# Patient Record
Sex: Female | Born: 1990
Health system: Southern US, Community
[De-identification: ages and names within clinical notes are randomized; demographics above are authoritative.]

## PROBLEM LIST (undated history)

## (undated) ENCOUNTER — Emergency Department (HOSPITAL_COMMUNITY): Admission: EM | Payer: Medicaid Other

## (undated) DIAGNOSIS — N390 Urinary tract infection, site not specified: Secondary | ICD-10-CM

## (undated) DIAGNOSIS — O139 Gestational [pregnancy-induced] hypertension without significant proteinuria, unspecified trimester: Secondary | ICD-10-CM

## (undated) DIAGNOSIS — E119 Type 2 diabetes mellitus without complications: Secondary | ICD-10-CM

## (undated) DIAGNOSIS — R87629 Unspecified abnormal cytological findings in specimens from vagina: Secondary | ICD-10-CM

## (undated) HISTORY — PX: NO PAST SURGERIES: SHX2092

## (undated) HISTORY — DX: Gestational (pregnancy-induced) hypertension without significant proteinuria, unspecified trimester: O13.9

---

## 2011-01-27 ENCOUNTER — Emergency Department (HOSPITAL_COMMUNITY)
Admission: EM | Admit: 2011-01-27 | Discharge: 2011-01-28 | Disposition: A | Discharge status: Home / Self Care | Payer: BC Managed Care – PPO | Attending: Emergency Medicine | Admitting: Emergency Medicine

## 2011-01-27 DIAGNOSIS — N898 Other specified noninflammatory disorders of vagina: Secondary | ICD-10-CM | POA: Insufficient documentation

## 2011-01-27 DIAGNOSIS — N72 Inflammatory disease of cervix uteri: Secondary | ICD-10-CM | POA: Insufficient documentation

## 2011-01-27 LAB — URINALYSIS, ROUTINE W REFLEX MICROSCOPIC
Glucose, UA: NEGATIVE mg/dL
Nitrite: NEGATIVE
pH: 6 (ref 5.0–8.0)

## 2011-01-27 LAB — URINE MICROSCOPIC-ADD ON

## 2011-01-28 LAB — GC/CHLAMYDIA PROBE AMP, GENITAL: Chlamydia, DNA Probe: NEGATIVE

## 2011-01-28 LAB — WET PREP, GENITAL

## 2012-08-20 ENCOUNTER — Ambulatory Visit: Payer: BC Managed Care – PPO | Admitting: Obstetrics and Gynecology

## 2013-08-26 ENCOUNTER — Ambulatory Visit: Payer: Self-pay | Admitting: Women's Health

## 2013-11-09 ENCOUNTER — Emergency Department (HOSPITAL_COMMUNITY)
Admission: EM | Admit: 2013-11-09 | Discharge: 2013-11-09 | Disposition: A | Discharge status: Home / Self Care | Payer: BC Managed Care – PPO | Attending: Emergency Medicine | Admitting: Emergency Medicine

## 2013-11-09 ENCOUNTER — Encounter (HOSPITAL_COMMUNITY): Payer: Self-pay | Admitting: Emergency Medicine

## 2013-11-09 ENCOUNTER — Emergency Department (HOSPITAL_COMMUNITY): Payer: BC Managed Care – PPO

## 2013-11-09 DIAGNOSIS — B9689 Other specified bacterial agents as the cause of diseases classified elsewhere: Secondary | ICD-10-CM | POA: Insufficient documentation

## 2013-11-09 DIAGNOSIS — J069 Acute upper respiratory infection, unspecified: Secondary | ICD-10-CM | POA: Insufficient documentation

## 2013-11-09 DIAGNOSIS — N760 Acute vaginitis: Secondary | ICD-10-CM | POA: Insufficient documentation

## 2013-11-09 DIAGNOSIS — F17200 Nicotine dependence, unspecified, uncomplicated: Secondary | ICD-10-CM | POA: Insufficient documentation

## 2013-11-09 DIAGNOSIS — Z3202 Encounter for pregnancy test, result negative: Secondary | ICD-10-CM | POA: Insufficient documentation

## 2013-11-09 DIAGNOSIS — J988 Other specified respiratory disorders: Secondary | ICD-10-CM

## 2013-11-09 DIAGNOSIS — B9789 Other viral agents as the cause of diseases classified elsewhere: Secondary | ICD-10-CM

## 2013-11-09 DIAGNOSIS — R1110 Vomiting, unspecified: Secondary | ICD-10-CM | POA: Insufficient documentation

## 2013-11-09 DIAGNOSIS — N939 Abnormal uterine and vaginal bleeding, unspecified: Secondary | ICD-10-CM

## 2013-11-09 DIAGNOSIS — A499 Bacterial infection, unspecified: Secondary | ICD-10-CM | POA: Insufficient documentation

## 2013-11-09 LAB — URINALYSIS, ROUTINE W REFLEX MICROSCOPIC
BILIRUBIN URINE: NEGATIVE
Glucose, UA: NEGATIVE mg/dL
Ketones, ur: NEGATIVE mg/dL
Leukocytes, UA: NEGATIVE
Nitrite: NEGATIVE
PROTEIN: NEGATIVE mg/dL
Specific Gravity, Urine: 1.03 (ref 1.005–1.030)
UROBILINOGEN UA: 1 mg/dL (ref 0.0–1.0)
pH: 6 (ref 5.0–8.0)

## 2013-11-09 LAB — URINE MICROSCOPIC-ADD ON

## 2013-11-09 LAB — RAPID STREP SCREEN (MED CTR MEBANE ONLY): Streptococcus, Group A Screen (Direct): NEGATIVE

## 2013-11-09 LAB — WET PREP, GENITAL
TRICH WET PREP: NONE SEEN
Yeast Wet Prep HPF POC: NONE SEEN

## 2013-11-09 LAB — PREGNANCY, URINE: PREG TEST UR: NEGATIVE

## 2013-11-09 MED ORDER — ACETAMINOPHEN 500 MG PO TABS: 500.0000 mg | ORAL_TABLET | Freq: Four times a day (QID) | ORAL | Status: DC | PRN

## 2013-11-09 MED ORDER — IBUPROFEN 800 MG PO TABS: 800.0000 mg | ORAL_TABLET | Freq: Three times a day (TID) | ORAL | Status: DC

## 2013-11-09 MED ORDER — METRONIDAZOLE 500 MG PO TABS: 500.0000 mg | ORAL_TABLET | Freq: Two times a day (BID) | ORAL | Status: DC

## 2013-11-09 MED ORDER — GUAIFENESIN 100 MG/5ML PO LIQD: 100.0000 mg | ORAL | Status: DC | PRN

## 2013-11-09 NOTE — ED Provider Notes (Signed)
CSN: [id number]     Arrival date & time 11/09/13  4098 History  This chart was scribed for Francee Piccolo, PA-C, working with Shanna Cisco, MD by Blanchard Kelch, ED Scribe. This patient was seen in room TR07C/TR07C and the patient's care was started at 9:48 AM.    Chief Complaint  Patient presents with  . Influenza    The history is provided by the patient. No language interpreter was used.    HPI Comments: Helen Dorsey is a G0 23 y.o. female who presents to the Emergency Department complaining of constant, unchanged cough that began about three days ago. She has associated congestion, sore throat and subjective  fever. She states that she also had two episodes of non-bloody vomiting last night. She took Tylenol Multi Symptom last night, which provided temporary relief until the vomiting began. She denies sick contacts at home.  She also reports suprapubic abdominal pain that began last night but has subsided this morning. She states that she has had associated intermittent vaginal bleeding for about five days. She states it is very unusual for her to be spotting when not on her menstrual cycle. She denies any associated discharge or dysuria. She denies any past abdominal surgeries in the past. She denies any past history of pregnancies. She denies starting any new birth control medications. She does not have an OB-GYN currently.    History reviewed. No pertinent past medical history. No past surgical history on file. No family history on file. History  Substance Use Topics  . Smoking status: Current Every Day Smoker  . Smokeless tobacco: Not on file  . Alcohol Use: Yes   OB History   Grav Para Term Preterm Abortions TAB SAB Ect Mult Living                 Review of Systems  Constitutional: Positive for fever.  HENT: Positive for congestion and sore throat.   Respiratory: Positive for cough.   Gastrointestinal: Positive for vomiting.  Genitourinary: Positive for vaginal  bleeding. Negative for dysuria and vaginal discharge.  All other systems reviewed and are negative.    Allergies  Review of patient's allergies indicates no known allergies.  Home Medications   Current Outpatient Rx  Name  Route  Sig  Dispense  Refill  . Chlorphen-PE-Acetaminophen (TYLENOL ALLERGY MULTI-SYMPTOM PO)   Oral   Take 1,920 mg by mouth daily as needed (for congestion/sore throat).         Marland Kitchen acetaminophen (TYLENOL) 500 MG tablet   Oral   Take 1 tablet (500 mg total) by mouth every 6 (six) hours as needed.   30 tablet   0   . guaiFENesin (ROBITUSSIN) 100 MG/5ML liquid   Oral   Take 5-10 mLs (100-200 mg total) by mouth every 4 (four) hours as needed for cough.   60 mL   0   . ibuprofen (ADVIL,MOTRIN) 800 MG tablet   Oral   Take 1 tablet (800 mg total) by mouth 3 (three) times daily.   21 tablet   0   . metroNIDAZOLE (FLAGYL) 500 MG tablet   Oral   Take 1 tablet (500 mg total) by mouth 2 (two) times daily.   14 tablet   0    Triage Vitals: BP 144/85  Pulse 82  Temp(Src) 97.7 F (36.5 C)  SpO2 100%  Physical Exam  Nursing note and vitals reviewed. Constitutional: She is oriented to person, place, and time. She appears well-developed and well-nourished. No  distress.  HENT:  Head: Normocephalic and atraumatic.  Right Ear: External ear normal.  Left Ear: External ear normal.  Nose: Nose normal.  Mouth/Throat: Uvula is midline. Posterior oropharyngeal erythema present. No oropharyngeal exudate, posterior oropharyngeal edema or tonsillar abscesses.  Eyes: Conjunctivae are normal.  Neck: Normal range of motion. Neck supple.  Cardiovascular: Normal rate, regular rhythm and normal heart sounds.   Pulmonary/Chest: Effort normal and breath sounds normal. No respiratory distress.  Abdominal: Soft. Bowel sounds are normal. She exhibits no distension. There is no tenderness. There is no rebound and no guarding.  Musculoskeletal: Normal range of motion.   Lymphadenopathy:    She has no cervical adenopathy.  Neurological: She is alert and oriented to person, place, and time.  Skin: Skin is warm and dry. She is not diaphoretic.  Psychiatric: She has a normal mood and affect.   Exam performed by Francee Piccolo L,  exam chaperoned Date: 11/09/2013 Pelvic exam: normal external genitalia without evidence of trauma. VULVA: normal appearing vulva with no masses, tenderness or lesion. VAGINA: normal appearing vagina with normal color and discharge, no lesions. CERVIX: normal appearing cervix without lesions, cervical motion tenderness absent, cervical os closed with out purulent discharge; vaginal discharge - bloody and scant, Wet prep and DNA probe for chlamydia and GC obtained.   ADNEXA: normal adnexa in size, nontender and no masses UTERUS: uterus is normal size, shape, consistency and nontender.   ED Course  Procedures (including critical care time)  DIAGNOSTIC STUDIES: Oxygen Saturation is 100% on room air, normal by my interpretation.    COORDINATION OF CARE: 9:55 AM -Will order chest x-ray, urine pregnancy test, Urinalysis, Wet prep, GC/Chlamydia and rapid strep screen. Patient verbalizes understanding and agrees with treatment plan.    Labs Review Labs Reviewed  WET PREP, GENITAL - Abnormal; Notable for the following:    Clue Cells Wet Prep HPF POC MODERATE (*)    WBC, Wet Prep HPF POC FEW (*)    All other components within normal limits  URINALYSIS, ROUTINE W REFLEX MICROSCOPIC - Abnormal; Notable for the following:    Hgb urine dipstick MODERATE (*)    All other components within normal limits  RAPID STREP SCREEN  GC/CHLAMYDIA PROBE AMP  CULTURE, GROUP A STREP  PREGNANCY, URINE  URINE MICROSCOPIC-ADD ON   Imaging Review Dg Chest 2 View  11/09/2013   CLINICAL DATA:  cough ,fever cough ,fever  EXAM: CHEST  2 VIEW  COMPARISON:  None.  FINDINGS: The heart size and mediastinal contours are within normal limits. Both  lungs are clear. The visualized skeletal structures are unremarkable.  IMPRESSION: No active cardiopulmonary disease.   Electronically Signed   By: Elige Ko   On: 11/09/2013 10:51    EKG Interpretation   None       MDM   1. Viral respiratory illness   2. Vaginal bleeding   3. Bacterial vaginosis    Filed Vitals:   11/09/13 1129  BP: 104/55  Pulse: 88  Temp:   Resp: 18    Afebrile, NAD, non-toxic appearing, AAOx4. I have reviewed nursing notes, vital signs, and all appropriate lab and imaging results for this patient.  1) URI: Pt CXR negative for acute infiltrate. Patients symptoms are consistent with URI, likely viral etiology. Discussed that antibiotics are not indicated for viral infections. Pt will be discharged with symptomatic treatment.  Verbalizes understanding and is agreeable with plan.   2) Vaginal Bleeding: Pelvic exam with scant bloody discharge. Pelvic exam  otherwise unremarkable. No CMT. No adnexal fullness. Pregnancy test is negative, no concern for ectopic pregnancy. Wet prep reviewed. Advised Ob/Gyn follow up for dysfunctional uterine bleeding.  Pt was not prophylactically treated given low historical suspicion and only few WBCs on wet prep. Pt advised that she will receive a call in 48 hours if the test is positive and to refrain from sexual activity for 48 hours. If the test is positive, she will need to return to ED or go to Ob/Gyn for treatment pt is then advised to refrain from sexual activity for 10 days for the medicine to take effect.  Pt not concerning for PID because hemodynamically stable and no cervical motion tenderness on pelvic exam.  Discussed that because pt has had recent unprotected sex, might want to consider getting tested for HIV as well. Counseled pt that latex condoms are the only way to prevent against STDs or HIV.  3) BV: Pt has also been treated with flagyl for Bacterial Vaginosis. Pt has been advised to not drink alcohol while on this  medication.   Patient to be discharged with instructions to follow up with OBGYN. Pt understands GC/Chlamydia cultures pending and that they will need to inform all sexual partners within the last 6 months if results return positive. Pt is hemodynamically stable & in NAD prior to dc.  I personally performed the services described in this documentation, which was scribed in my presence. The recorded information has been reviewed and is accurate.     Jeannetta EllisJennifer L Kishawn Pickar, PA-C 11/09/13 1258

## 2013-11-09 NOTE — ED Notes (Signed)
Patient transported to X-ray 

## 2013-11-09 NOTE — ED Provider Notes (Signed)
Medical screening examination/treatment/procedure(s) were performed by non-physician practitioner and as supervising physician I was immediately available for consultation/collaboration.  Shanna CiscoMegan E Yoel Kaufhold, MD 11/09/13 2013

## 2013-11-09 NOTE — ED Notes (Signed)
Vaginal bleeding/spotting x 5 days. States always has had irregular periods, last one in spring of 2014. Reports lower abdominal pain. Also reports cough/congestion.

## 2013-11-09 NOTE — Discharge Instructions (Signed)
Please follow up with your primary care physician in 1-2 days. If you do not have one please call the [facility] number listed above. Please follow up with the Ob/Gyn to schedule a follow up appointment. Please alternate between Motrin and Tylenol to help with aches and pains. Please take your antibiotic until completion. Please take Robitussin to help with cough. Please read all discharge instructions and return precautions.   Upper Respiratory Infection, Adult An upper respiratory infection (URI) is also sometimes known as the common cold. The upper respiratory tract includes the nose, sinuses, throat, trachea, and bronchi. Bronchi are the airways leading to the lungs. Most people improve within 1 week, but symptoms can last up to 2 weeks. A residual cough may last even longer.  CAUSES Many different viruses can infect the tissues lining the upper respiratory tract. The tissues become irritated and inflamed and often become very moist. Mucus production is also common. A cold is contagious. You can easily spread the virus to others by oral contact. This includes kissing, sharing a glass, coughing, or sneezing. Touching your mouth or nose and then touching a surface, which is then touched by another person, can also spread the virus. SYMPTOMS  Symptoms typically develop 1 to 3 days after you come in contact with a cold virus. Symptoms vary from person to person. They may include:  Runny nose.  Sneezing.  Nasal congestion.  Sinus irritation.  Sore throat.  Loss of voice (laryngitis).  Cough.  Fatigue.  Muscle aches.  Loss of appetite.  Headache.  Low-grade fever. DIAGNOSIS  You might diagnose your own cold based on familiar symptoms, since most people get a cold 2 to 3 times a year. Your caregiver can confirm this based on your exam. Most importantly, your caregiver can check that your symptoms are not due to another disease such as strep throat, sinusitis,  pneumonia, asthma, or epiglottitis. Blood tests, throat tests, and X-rays are not necessary to diagnose a common cold, but they may sometimes be helpful in excluding other more serious diseases. Your caregiver will decide if any further tests are required. RISKS AND COMPLICATIONS  You may be at risk for a more severe case of the common cold if you smoke cigarettes, have chronic heart disease (such as heart failure) or lung disease (such as asthma), or if you have a weakened immune system. The very young and very old are also at risk for more serious infections. Bacterial sinusitis, middle ear infections, and bacterial pneumonia can complicate the common cold. The common cold can worsen asthma and chronic obstructive pulmonary disease (COPD). Sometimes, these complications can require emergency medical care and may be life-threatening. PREVENTION  The best way to protect against getting a cold is to practice good hygiene. Avoid oral or hand contact with people with cold symptoms. Wash your hands often if contact occurs. There is no clear evidence that vitamin C, vitamin E, echinacea, or exercise reduces the chance of developing a cold. However, it is always recommended to get plenty of rest and practice good nutrition. TREATMENT  Treatment is directed at relieving symptoms. There is no cure. Antibiotics are not effective, because the infection is caused by a virus, not by bacteria. Treatment may include:  Increased fluid intake. Sports drinks offer valuable electrolytes, sugars, and fluids.  Breathing heated mist or steam (vaporizer or shower).  Eating chicken soup or other clear broths, and maintaining good nutrition.  Getting plenty of rest.  Using gargles or lozenges  for comfort.  Controlling fevers with ibuprofen or acetaminophen as directed by your caregiver.  Increasing usage of your inhaler if you have asthma. Zinc gel and zinc lozenges, taken in the first 24 hours of the common cold, can  shorten the duration and lessen the severity of symptoms. Pain medicines may help with fever, muscle aches, and throat pain. A variety of non-prescription medicines are available to treat congestion and runny nose. Your caregiver can make recommendations and may suggest nasal or lung inhalers for other symptoms.  HOME CARE INSTRUCTIONS   Only take over-the-counter or prescription medicines for pain, discomfort, or fever as directed by your caregiver.  Use a warm mist humidifier or inhale steam from a shower to increase air moisture. This may keep secretions moist and make it easier to breathe.  Drink enough water and fluids to keep your urine clear or pale yellow.  Rest as needed.  Return to work when your temperature has returned to normal or as your caregiver advises. You may need to stay home longer to avoid infecting others. You can also use a face mask and careful hand washing to prevent spread of the virus. SEEK MEDICAL CARE IF:   After the first few days, you feel you are getting worse rather than better.  You need your caregiver's advice about medicines to control symptoms.  You develop chills, worsening shortness of breath, or brown or red sputum. These may be signs of pneumonia.  You develop yellow or brown nasal discharge or pain in the face, especially when you bend forward. These may be signs of sinusitis.  You develop a fever, swollen neck glands, pain with swallowing, or white areas in the back of your throat. These may be signs of strep throat. SEEK IMMEDIATE MEDICAL CARE IF:   You have a fever.  You develop severe or persistent headache, ear pain, sinus pain, or chest pain.  You develop wheezing, a prolonged cough, cough up blood, or have a change in your usual mucus (if you have chronic lung disease).  You develop sore muscles or a stiff neck. Document Released: 03/25/2001 Document Revised: 12/22/2011 Document Reviewed: 01/31/2011 [facility] Patient Information 2014  [employer] [place].   Dysfunctional Uterine Bleeding Normally, menstrual periods begin between ages 1711 to 117 in young women. A normal menstrual cycle/period may begin every 23 days up to 35 days and lasts from 1 to 7 days. Around 12 to 14 days before your menstrual period starts, ovulation (ovary produces an egg) occurs. When counting the time between menstrual periods, count from the first day of bleeding of the previous period to the first day of bleeding of the next period. Dysfunctional (abnormal) uterine bleeding is bleeding that is different from a normal menstrual period. Your periods may come earlier or later than usual. They may be lighter, have blood clots or be heavier. You may have bleeding between periods, or you may skip one period or more. You may have bleeding after sexual intercourse, bleeding after menopause, or no menstrual period. CAUSES   Pregnancy (normal, miscarriage, tubal).  IUDs (intrauterine device, birth control).  Birth control pills.  Hormone treatment.  Menopause.  Infection of the cervix.  Blood clotting problems.  Infection of the inside lining of the uterus.  Endometriosis, inside lining of the uterus growing in the pelvis and other female organs.  Adhesions (scar tissue) inside the uterus.  Obesity or severe weight loss.  Uterine polyps inside the uterus.  Cancer of the vagina, cervix, or uterus.  Ovarian cysts or polycystic ovary syndrome.  Medical problems (diabetes, thyroid disease).  Uterine fibroids (noncancerous tumor).  Problems with your female hormones.  Endometrial hyperplasia, very thick lining and enlarged cells inside of the uterus.  Medicines that interfere with ovulation.  Radiation to the pelvis or abdomen.  Chemotherapy. DIAGNOSIS   Your doctor will discuss the history of your menstrual periods, medicines you are taking, changes in your weight, stress in your life, and any medical problems you may have.  Your doctor  will do a physical and pelvic examination.  Your doctor may want to perform certain tests to make a diagnosis, such as:  Pap test.  Blood tests.  Cultures for infection.  CT scan.  Ultrasound.  Hysteroscopy.  Laparoscopy.  MRI.  Hysterosalpingography.  D and C.  Endometrial biopsy. TREATMENT  Treatment will depend on the cause of the dysfunctional uterine bleeding (DUB). Treatment may include:  Observing your menstrual periods for a couple of months.  Prescribing medicines for medical problems, including:  Antibiotics.  Hormones.  Birth control pills.  Removing an IUD (intrauterine device, birth control).  Surgery:  D and C (scrape and remove tissue from inside the uterus).  Laparoscopy (examine inside the abdomen with a lighted tube).  Uterine ablation (destroy lining of the uterus with electrical current, laser, heat, or freezing).  Hysteroscopy (examine cervix and uterus with a lighted tube).  Hysterectomy (remove the uterus). HOME CARE INSTRUCTIONS   If medicines were prescribed, take exactly as directed. Do not change or switch medicines without consulting your caregiver.  Long term heavy bleeding may result in iron deficiency. Your caregiver may have prescribed iron pills. They help replace the iron that your body lost from heavy bleeding. Take exactly as directed.  Do not take aspirin or medicines that contain aspirin one week before or during your menstrual period. Aspirin may make the bleeding worse.  If you need to change your sanitary pad or tampon more than once every 2 hours, stay in bed with your feet elevated and a cold pack on your lower abdomen. Rest as much as possible, until the bleeding stops or slows down.  Eat well-balanced meals. Eat foods high in iron. Examples are:  Leafy green vegetables.  Whole-grain breads and cereals.  Eggs.  Meat.  Liver.  Do not try to lose weight until the abnormal bleeding has stopped and your  blood iron level is back to normal. Do not lift more than ten pounds or do strenuous activities when you are bleeding.  For a couple of months, make note on your calendar, marking the start and ending of your period, and the type of bleeding (light, medium, heavy, spotting, clots or missed periods). This is for your caregiver to better evaluate your problem. SEEK MEDICAL CARE IF:   You develop nausea (feeling sick to your stomach) and vomiting, dizziness, or diarrhea while you are taking your medicine.  You are getting lightheaded or weak.  You have any problems that may be related to the medicine you are taking.  You develop pain with your DUB.  You want to remove your IUD.  You want to stop or change your birth control pills or hormones.  You have any type of abnormal bleeding mentioned above.  You are over 27 years old and have not had a menstrual period yet.  You are 23 years old and you are still having menstrual periods.  You have any of the symptoms mentioned above.  You develop a rash. SEEK  IMMEDIATE MEDICAL CARE IF:   An oral temperature above 102 F (38.9 C) develops.  You develop chills.  You are changing your sanitary pad or tampon more than once an hour.  You develop abdominal pain.  You pass out or faint. Document Released: 09/26/2000 Document Revised: 12/22/2011 Document Reviewed: 08/28/2009 [facility] Patient Information 2014 [place], [place]. Bacterial Vaginosis Bacterial vaginosis is a vaginal infection that occurs when the normal balance of bacteria in the vagina is disrupted. It results from an overgrowth of certain bacteria. This is the most common vaginal infection in women of childbearing age. Treatment is important to prevent complications, especially in pregnant women, as it can cause a premature delivery. CAUSES  Bacterial vaginosis is caused by an increase in harmful bacteria that are normally present in smaller amounts in the vagina. Several  different kinds of bacteria can cause bacterial vaginosis. However, the reason that the condition develops is not fully understood. RISK FACTORS Certain activities or behaviors can put you at an increased risk of developing bacterial vaginosis, including:  Having a new sex partner or multiple sex partners.  Douching.  Using an intrauterine device (IUD) for contraception. Women do not get bacterial vaginosis from toilet seats, bedding, swimming pools, or contact with objects around them. SIGNS AND SYMPTOMS  Some women with bacterial vaginosis have no signs or symptoms. Common symptoms include:  Grey vaginal discharge.  A fishlike odor with discharge, especially after sexual intercourse.  Itching or burning of the vagina and vulva.  Burning or pain with urination. DIAGNOSIS  Your health care provider will take a medical history and examine the vagina for signs of bacterial vaginosis. A sample of vaginal fluid may be taken. Your health care provider will look at this sample under a microscope to check for bacteria and abnormal cells. A vaginal pH test may also be done.  TREATMENT  Bacterial vaginosis may be treated with antibiotic medicines. These may be given in the form of a pill or a vaginal cream. A second round of antibiotics may be prescribed if the condition comes back after treatment.  HOME CARE INSTRUCTIONS   Only take over-the-counter or prescription medicines as directed by your health care provider.  If antibiotic medicine was prescribed, take it as directed. Make sure you finish it even if you start to feel better.  Do not have sex until treatment is completed.  Tell all sexual partners that you have a vaginal infection. They should see their health care provider and be treated if they have problems, such as a mild rash or itching.  Practice safe sex by using condoms and only having one sex partner. SEEK MEDICAL CARE IF:   Your symptoms are not improving after 3 days of  treatment.  You have increased discharge or pain.  You have a fever. MAKE SURE YOU:   Understand these instructions.  Will watch your condition.  Will get help right away if you are not doing well or get worse. FOR MORE INFORMATION  Centers for Disease Control and Prevention, Division of STD Prevention: [employer] American Sexual Health Association (ASHA): www.ashastd.org  Document Released: 09/29/2005 Document Revised: 07/20/2013 Document Reviewed: 05/11/2013 [facility] Patient Information 2014 [place], [place].

## 2013-11-09 NOTE — ED Notes (Signed)
3 day hx of flu like s/s h/a cough congestion

## 2013-11-10 LAB — GC/CHLAMYDIA PROBE AMP
CT PROBE, AMP APTIMA: NEGATIVE
GC PROBE AMP APTIMA: NEGATIVE

## 2013-11-11 LAB — CULTURE, GROUP A STREP

## 2014-01-29 DIAGNOSIS — F17200 Nicotine dependence, unspecified, uncomplicated: Secondary | ICD-10-CM | POA: Insufficient documentation

## 2014-01-29 DIAGNOSIS — R61 Generalized hyperhidrosis: Secondary | ICD-10-CM | POA: Insufficient documentation

## 2014-01-29 DIAGNOSIS — R55 Syncope and collapse: Secondary | ICD-10-CM | POA: Insufficient documentation

## 2014-01-29 DIAGNOSIS — Z3202 Encounter for pregnancy test, result negative: Secondary | ICD-10-CM | POA: Insufficient documentation

## 2014-01-29 DIAGNOSIS — E876 Hypokalemia: Secondary | ICD-10-CM | POA: Insufficient documentation

## 2014-01-29 DIAGNOSIS — R109 Unspecified abdominal pain: Secondary | ICD-10-CM | POA: Insufficient documentation

## 2014-01-30 ENCOUNTER — Emergency Department (HOSPITAL_COMMUNITY)
Admission: EM | Admit: 2014-01-30 | Discharge: 2014-01-30 | Disposition: A | Discharge status: Home / Self Care | Payer: BC Managed Care – PPO | Attending: Emergency Medicine | Admitting: Emergency Medicine

## 2014-01-30 ENCOUNTER — Encounter (HOSPITAL_COMMUNITY): Payer: Self-pay | Admitting: Emergency Medicine

## 2014-01-30 DIAGNOSIS — E876 Hypokalemia: Secondary | ICD-10-CM

## 2014-01-30 DIAGNOSIS — R109 Unspecified abdominal pain: Secondary | ICD-10-CM

## 2014-01-30 DIAGNOSIS — R55 Syncope and collapse: Secondary | ICD-10-CM

## 2014-01-30 DIAGNOSIS — R110 Nausea: Secondary | ICD-10-CM

## 2014-01-30 LAB — I-STAT CHEM 8, ED
BUN: 10 mg/dL (ref 6–23)
CHLORIDE: 106 meq/L (ref 96–112)
Calcium, Ion: 1.18 mmol/L (ref 1.12–1.23)
Creatinine, Ser: 0.9 mg/dL (ref 0.50–1.10)
Glucose, Bld: 99 mg/dL (ref 70–99)
HEMATOCRIT: 46 % (ref 36.0–46.0)
Hemoglobin: 15.6 g/dL — ABNORMAL HIGH (ref 12.0–15.0)
POTASSIUM: 3.5 meq/L — AB (ref 3.7–5.3)
SODIUM: 142 meq/L (ref 137–147)
TCO2: 24 mmol/L (ref 0–100)

## 2014-01-30 LAB — CBC
HCT: 39.4 % (ref 36.0–46.0)
Hemoglobin: 14.1 g/dL (ref 12.0–15.0)
MCH: 27.7 pg (ref 26.0–34.0)
MCHC: 35.8 g/dL (ref 30.0–36.0)
MCV: 77.4 fL — ABNORMAL LOW (ref 78.0–100.0)
PLATELETS: 283 10*3/uL (ref 150–400)
RBC: 5.09 MIL/uL (ref 3.87–5.11)
RDW: 14.5 % (ref 11.5–15.5)
WBC: 10.8 10*3/uL — AB (ref 4.0–10.5)

## 2014-01-30 LAB — URINALYSIS, ROUTINE W REFLEX MICROSCOPIC
BILIRUBIN URINE: NEGATIVE
Glucose, UA: NEGATIVE mg/dL
Hgb urine dipstick: NEGATIVE
Ketones, ur: 15 mg/dL — AB
Leukocytes, UA: NEGATIVE
NITRITE: NEGATIVE
Protein, ur: NEGATIVE mg/dL
SPECIFIC GRAVITY, URINE: 1.036 — AB (ref 1.005–1.030)
UROBILINOGEN UA: 0.2 mg/dL (ref 0.0–1.0)
pH: 5.5 (ref 5.0–8.0)

## 2014-01-30 LAB — POC URINE PREG, ED: PREG TEST UR: NEGATIVE

## 2014-01-30 MED ORDER — ONDANSETRON HCL 4 MG PO TABS: 4.0000 mg | ORAL_TABLET | Freq: Four times a day (QID) | ORAL | Status: DC | PRN

## 2014-01-30 MED ORDER — POTASSIUM CHLORIDE CRYS ER 20 MEQ PO TBCR
40.0000 meq | EXTENDED_RELEASE_TABLET | Freq: Once | ORAL | Status: AC
  Administered 2014-01-30: 40 meq via ORAL
  Filled 2014-01-30: qty 2

## 2014-01-30 MED ORDER — SODIUM CHLORIDE 0.9 % IV SOLN: 1000.0000 mL | INTRAVENOUS | Status: DC

## 2014-01-30 MED ORDER — SODIUM CHLORIDE 0.9 % IV SOLN
1000.0000 mL | Freq: Once | INTRAVENOUS | Status: AC
  Administered 2014-01-30: 1000 mL via INTRAVENOUS

## 2014-01-30 MED ORDER — ONDANSETRON HCL 4 MG/2ML IJ SOLN
4.0000 mg | Freq: Once | INTRAMUSCULAR | Status: AC
  Administered 2014-01-30: 4 mg via INTRAVENOUS
  Filled 2014-01-30: qty 2

## 2014-01-30 NOTE — ED Notes (Signed)
Patient presents stating she was feeling light headed at home.  Walked to the bathroom and rested her head on the sink   Next thing she remembers her boyfriend was shaking her calling her name.  Also stated that she has an undescribeable feeling in her stomach

## 2014-01-30 NOTE — ED Provider Notes (Signed)
CSN: [id number]     Arrival date & time 01/29/14  2354 History   First MD Initiated Contact with Patient 01/30/14 [phone]     Chief Complaint  Patient presents with  . Loss of Consciousness     (Consider location/radiation/quality/duration/timing/severity/associated sxs/prior Treatment) Patient is a 23 y.o. female presenting with syncope. The history is provided by the patient.  Loss of Consciousness She states that she had a "indescribable feeling" in her abdomen. This was a pressure feeling in the midabdomen with intense nausea. She was feeling a little lightheaded and she went up to the bathroom and as she leaned over the sink, she passed out. Loss of consciousness was fairly brief-she estimates at less than 1 minute. There is associated diaphoresis. She feels considerably better now, although her stomach still has an empty feeling. There is minimal residual nausea and she is feeling slightly lightheaded. There is no chest pain, heaviness, tightness, pressure. She denies fever or or chills.  History reviewed. No pertinent past medical history. History reviewed. No pertinent past surgical history. History reviewed. No pertinent family history. History  Substance Use Topics  . Smoking status: Current Every Day Smoker  . Smokeless tobacco: Never Used  . Alcohol Use: Yes     Comment: ocassionally   OB History   Grav Para Term Preterm Abortions TAB SAB Ect Mult Living                 Review of Systems  Cardiovascular: Positive for syncope.  All other systems reviewed and are negative.     Allergies  Review of patient's allergies indicates no known allergies.  Home Medications   Prior to Admission medications   Not on File   BP 114/67  Pulse 88  Temp(Src) 97.6 F (36.4 C) (Oral)  Resp 18  Ht 5\' 3"  (1.6 m)  Wt 218 lb 1 oz (98.913 kg)  BMI 38.64 kg/m2  SpO2 99%  LMP 01/22/2014 Physical Exam  Nursing note and vitals reviewed.  23 year old female, resting comfortably  and in no acute distress. Vital signs are normal. Oxygen saturation is 99%, which is normal. Head is normocephalic and atraumatic. PERRLA, EOMI. Oropharynx is clear. Neck is nontender and supple without adenopathy or JVD. Back is nontender and there is no CVA tenderness. Lungs are clear without rales, wheezes, or rhonchi. Chest is nontender. Heart has regular rate and rhythm without murmur. Abdomen is soft, flat, nontender without masses or hepatosplenomegaly and peristalsis is hypoactive. Extremities have no cyanosis or edema, full range of motion is present. Skin is warm and dry without rash. Neurologic: Mental status is normal, cranial nerves are intact, there are no motor or sensory deficits.  ED Course  Procedures (including critical care time) Labs Review Results for orders placed during the hospital encounter of 01/30/14  CBC      Result Value Ref Range   WBC 10.8 (*) 4.0 - 10.5 K/uL   RBC 5.09  3.87 - 5.11 MIL/uL   Hemoglobin 14.1  12.0 - 15.0 g/dL   HCT 95.639.4  21.336.0 - 08.646.0 %   MCV 77.4 (*) 78.0 - 100.0 fL   MCH 27.7  26.0 - 34.0 pg   MCHC 35.8  30.0 - 36.0 g/dL   RDW 57.814.5  46.911.5 - 62.915.5 %   Platelets 283  150 - 400 K/uL  URINALYSIS, ROUTINE W REFLEX MICROSCOPIC      Result Value Ref Range   Color, Urine YELLOW  YELLOW   APPearance CLEAR  CLEAR   Specific Gravity, Urine 1.036 (*) 1.005 - 1.030   pH 5.5  5.0 - 8.0   Glucose, UA NEGATIVE  NEGATIVE mg/dL   Hgb urine dipstick NEGATIVE  NEGATIVE   Bilirubin Urine NEGATIVE  NEGATIVE   Ketones, ur 15 (*) NEGATIVE mg/dL   Protein, ur NEGATIVE  NEGATIVE mg/dL   Urobilinogen, UA 0.2  0.0 - 1.0 mg/dL   Nitrite NEGATIVE  NEGATIVE   Leukocytes, UA NEGATIVE  NEGATIVE  I-STAT CHEM 8, ED      Result Value Ref Range   Sodium 142  137 - 147 mEq/L   Potassium 3.5 (*) 3.7 - 5.3 mEq/L   Chloride 106  96 - 112 mEq/L   BUN 10  6 - 23 mg/dL   Creatinine, Ser 4.090.90  0.50 - 1.10 mg/dL   Glucose, Bld 99  70 - 99 mg/dL   Calcium, Ion 8.111.18   9.141.12 - 1.23 mmol/L   TCO2 24  0 - 100 mmol/L   Hemoglobin 15.6 (*) 12.0 - 15.0 g/dL   HCT 78.246.0  95.636.0 - 21.346.0 %  POC URINE PREG, ED      Result Value Ref Range   Preg Test, Ur NEGATIVE  NEGATIVE   MDM   Final diagnoses:  Vasovagal syncope  Nausea  Abdominal pain  Hypokalemia    Syncope which appears to be vasovagal. Abdominal discomfort of uncertain cause. Urinalysis is significant for small ketones and high specific gravity suggesting mild dehydration. She'll be given IV hydration in the ED and IV ondansetron. Mild hypokalemia is noted and she's given a dose of oral potassium. I anticipate discharging her with a prescription for ondansetron for nausea.  She feels significantly better after the above noted treatment and is discharged.  Dione Boozeavid Markhi Kleckner, MD 01/30/14 [phone]

## 2014-01-30 NOTE — Discharge Instructions (Signed)
Vasovagal Syncope, Adult °Syncope, commonly known as fainting, is a temporary loss of consciousness. It occurs when the blood flow to the brain is reduced. Vasovagal syncope (also called neurocardiogenic syncope) is a fainting spell in which the blood flow to the brain is reduced because of a sudden drop in heart rate and blood pressure. Vasovagal syncope occurs when the brain and the cardiovascular system (blood vessels) do not adequately communicate and respond to each other. This is the most common cause of fainting. It often occurs in response to fear or some other type of emotional or physical stress. The body has a reaction in which the heart starts beating too slowly or the blood vessels expand, reducing blood pressure. This type of fainting spell is generally considered harmless. However, injuries can occur if a person takes a sudden fall during a fainting spell.  °CAUSES  °Vasovagal syncope occurs when a person's blood pressure and heart rate decrease suddenly, usually in response to a trigger. Many things and situations can trigger an episode. Some of these include:  °· Pain.   °· Fear.   °· The sight of blood or medical procedures, such as blood being drawn from a vein.   °· Common activities, such as coughing, swallowing, stretching, or going to the bathroom.   °· Emotional stress.   °· Prolonged standing, especially in a warm environment.   °· Lack of sleep or rest.   °· Prolonged lack of food.   °· Prolonged lack of fluids.   °· Recent illness. °· The use of certain drugs that affect blood pressure, such as cocaine, alcohol, marijuana, inhalants, and opiates.   °SYMPTOMS  °Before the fainting episode, you may:  °· Feel dizzy or light headed.   °· Become pale. °· Sense that you are going to faint.   °· Feel like the room is spinning.   °· Have tunnel vision, only seeing directly in front of you.   °· Feel sick to your stomach (nauseous).   °· See spots or slowly lose vision.   °· Hear ringing in your  ears.   °· Have a headache.   °· Feel warm and sweaty.   °· Feel a sensation of pins and needles. °During the fainting spell, you will generally be unconscious for no longer than a couple minutes before waking up and returning to normal. If you get up too quickly before your body can recover, you may faint again. Some twitching or jerky movements may occur during the fainting spell.  °DIAGNOSIS  °Your caregiver will ask about your symptoms, take a medical history, and perform a physical exam. Various tests may be done to rule out other causes of fainting. These may include blood tests and tests to check the heart, such as electrocardiography, echocardiography, and possibly an electrophysiology study. When other causes have been ruled out, a test may be done to check the body's response to changes in position (tilt table test). °TREATMENT  °Most cases of vasovagal syncope do not require treatment. Your caregiver may recommend ways to avoid fainting triggers and may provide home strategies for preventing fainting. If you must be exposed to a possible trigger, you can drink additional fluids to help reduce your chances of having an episode of vasovagal syncope. If you have warning signs of an oncoming episode, you can respond by positioning yourself favorably (lying down). °If your fainting spells continue, you may be given medicines to prevent fainting. Some medicines may help make you more resistant to repeated episodes of vasovagal syncope. Special exercises or compression stockings may be recommended. In rare cases, the surgical placement   of a pacemaker is considered. HOME CARE INSTRUCTIONS   Learn to identify the warning signs of vasovagal syncope.   Sit or lie down at the first warning sign of a fainting spell. If sitting, put your head down between your legs. If you lie down, swing your legs up in the air to increase blood flow to the brain.   Avoid hot tubs and saunas.  Avoid prolonged  standing.  Drink enough fluids to keep your urine clear or pale yellow. Avoid caffeine.  Increase salt in your diet as directed by your caregiver.   If you have to stand for a long time, perform movements such as:   Crossing your legs.   Flexing and stretching your leg muscles.   Squatting.   Moving your legs.   Bending over.   Only take over-the-counter or prescription medicines as directed by your caregiver. Do not suddenly stop any medicines without asking your caregiver first. SEEK MEDICAL CARE IF:   Your fainting spells continue or happen more frequently in spite of treatment.   You lose consciousness for more than a couple minutes.  You have fainting spells during or after exercising or after being startled.   You have new symptoms that occur with the fainting spells, such as:   Shortness of breath.  Chest pain.   Irregular heartbeat.   You have episodes of twitching or jerky movements that last longer than a few seconds.  You have episodes of twitching or jerky movements without obvious fainting. SEEK IMMEDIATE MEDICAL CARE IF:   You have injuries or bleeding after a fainting spell.   You have episodes of twitching or jerky movements that last longer than 5 minutes.   You have more than one spell of twitching or jerky movements before returning to consciousness after fainting. MAKE SURE YOU:   Understand these instructions.  Will watch your condition.  Will get help right away if you are not doing well or get worse. Document Released: 09/15/2012 Document Reviewed: 09/15/2012 [facility] Patient Information 2014 [place], [place].   Abdominal Pain, Adult Many things can cause abdominal pain. Usually, abdominal pain is not caused by a disease and will improve without treatment. It can often be observed and treated at home. Your health care provider will do a physical exam and possibly order blood tests and X-rays to help determine the  seriousness of your pain. However, in many cases, more time must pass before a clear cause of the pain can be found. Before that point, your health care provider may not know if you need more testing or further treatment. HOME CARE INSTRUCTIONS  Monitor your abdominal pain for any changes. The following actions may help to alleviate any discomfort you are experiencing:  Only take over-the-counter or prescription medicines as directed by your health care provider.  Do not take laxatives unless directed to do so by your health care provider.  Try a clear liquid diet (broth, tea, or water) as directed by your health care provider. Slowly move to a bland diet as tolerated. SEEK MEDICAL CARE IF:  You have unexplained abdominal pain.  You have abdominal pain associated with nausea or diarrhea.  You have pain when you urinate or have a bowel movement.  You experience abdominal pain that wakes you in the night.  You have abdominal pain that is worsened or improved by eating food.  You have abdominal pain that is worsened with eating fatty foods. SEEK IMMEDIATE MEDICAL CARE IF:   Your pain does  not go away within 2 hours.  You have a fever.  You keep throwing up (vomiting).  Your pain is felt only in portions of the abdomen, such as the right side or the left lower portion of the abdomen.  You pass bloody or black tarry stools. MAKE SURE YOU:  Understand these instructions.   Will watch your condition.   Will get help right away if you are not doing well or get worse.  Document Released: 07/09/2005 Document Revised: 07/20/2013 Document Reviewed: 06/08/2013 [facility] Patient Information 2014 [place], [place].  Ondansetron tablets What is this medicine? ONDANSETRON (on DAN se tron) is used to treat nausea and vomiting caused by chemotherapy. It is also used to prevent or treat nausea and vomiting after surgery. This medicine may be used for other purposes; ask your health care  provider or pharmacist if you have questions. COMMON BRAND NAME(S): Zofran What should I tell my health care provider before I take this medicine? They need to know if you have any of these conditions: -heart disease -history of irregular heartbeat -liver disease -low levels of magnesium or potassium in the blood -an unusual or allergic reaction to ondansetron, granisetron, other medicines, foods, dyes, or preservatives -pregnant or trying to get pregnant -breast-feeding How should I use this medicine? Take this medicine by mouth with a glass of water. Follow the directions on your prescription label. Take your doses at regular intervals. Do not take your medicine more often than directed. Talk to your pediatrician regarding the use of this medicine in children. Special care may be needed. Overdosage: If you think you have taken too much of this medicine contact a poison control center or emergency room at once. NOTE: This medicine is only for you. Do not share this medicine with others. What if I miss a dose? If you miss a dose, take it as soon as you can. If it is almost time for your next dose, take only that dose. Do not take double or extra doses. What may interact with this medicine? Do not take this medicine with any of the following medications: -apomorphine -certain medicines for fungal infections like fluconazole, itraconazole, ketoconazole, posaconazole, voriconazole -cisapride -dofetilide -dronedarone -pimozide -thioridazine -ziprasidone  This medicine may also interact with the following medications: -carbamazepine -certain medicines for depression, anxiety, or psychotic disturbances -fentanyl -linezolid -MAOIs like Carbex, Eldepryl, Marplan, Nardil, and Parnate -methylene blue (injected into a vein) -other medicines that prolong the QT interval (cause an abnormal heart rhythm) -phenytoin -rifampicin -tramadol This list may not describe all possible interactions.  Give your health care provider a list of all the medicines, herbs, non-prescription drugs, or dietary supplements you use. Also tell them if you smoke, drink alcohol, or use illegal drugs. Some items may interact with your medicine. What should I watch for while using this medicine? Check with your doctor or health care professional right away if you have any sign of an allergic reaction. What side effects may I notice from receiving this medicine? Side effects that you should report to your doctor or health care professional as soon as possible: -allergic reactions like skin rash, itching or hives, swelling of the face, lips or tongue -breathing problems -confusion -dizziness -fast or irregular heartbeat -feeling faint or lightheaded, falls -fever and chills -loss of balance or coordination -seizures -sweating -swelling of the hands or feet -tightness in the chest -tremors -unusually weak or tired Side effects that usually do not require medical attention (report to your doctor or health care  professional if they continue or are bothersome): -constipation or diarrhea -headache This list may not describe all possible side effects. Call your doctor for medical advice about side effects. You may report side effects to FDA at 1-800-FDA-1088. Where should I keep my medicine? Keep out of the reach of children. Store between 2 and 30 degrees C (36 and 86 degrees F). Throw away any unused medicine after the expiration date. NOTE: This sheet is a summary. It may not cover all possible information. If you have questions about this medicine, talk to your doctor, pharmacist, or health care provider.  2014, [employer]. (2013-07-06 16:27:45)

## 2014-01-30 NOTE — ED Notes (Signed)
Dr Glick in room. 

## 2015-01-31 IMAGING — CR DG CHEST 2V
2 series · 2 of 2 positions shown · non-contrast
Comparison: None.

CLINICAL DATA: cough ,fever cough ,fever

EXAM:
CHEST  2 VIEW

[w chest pa]
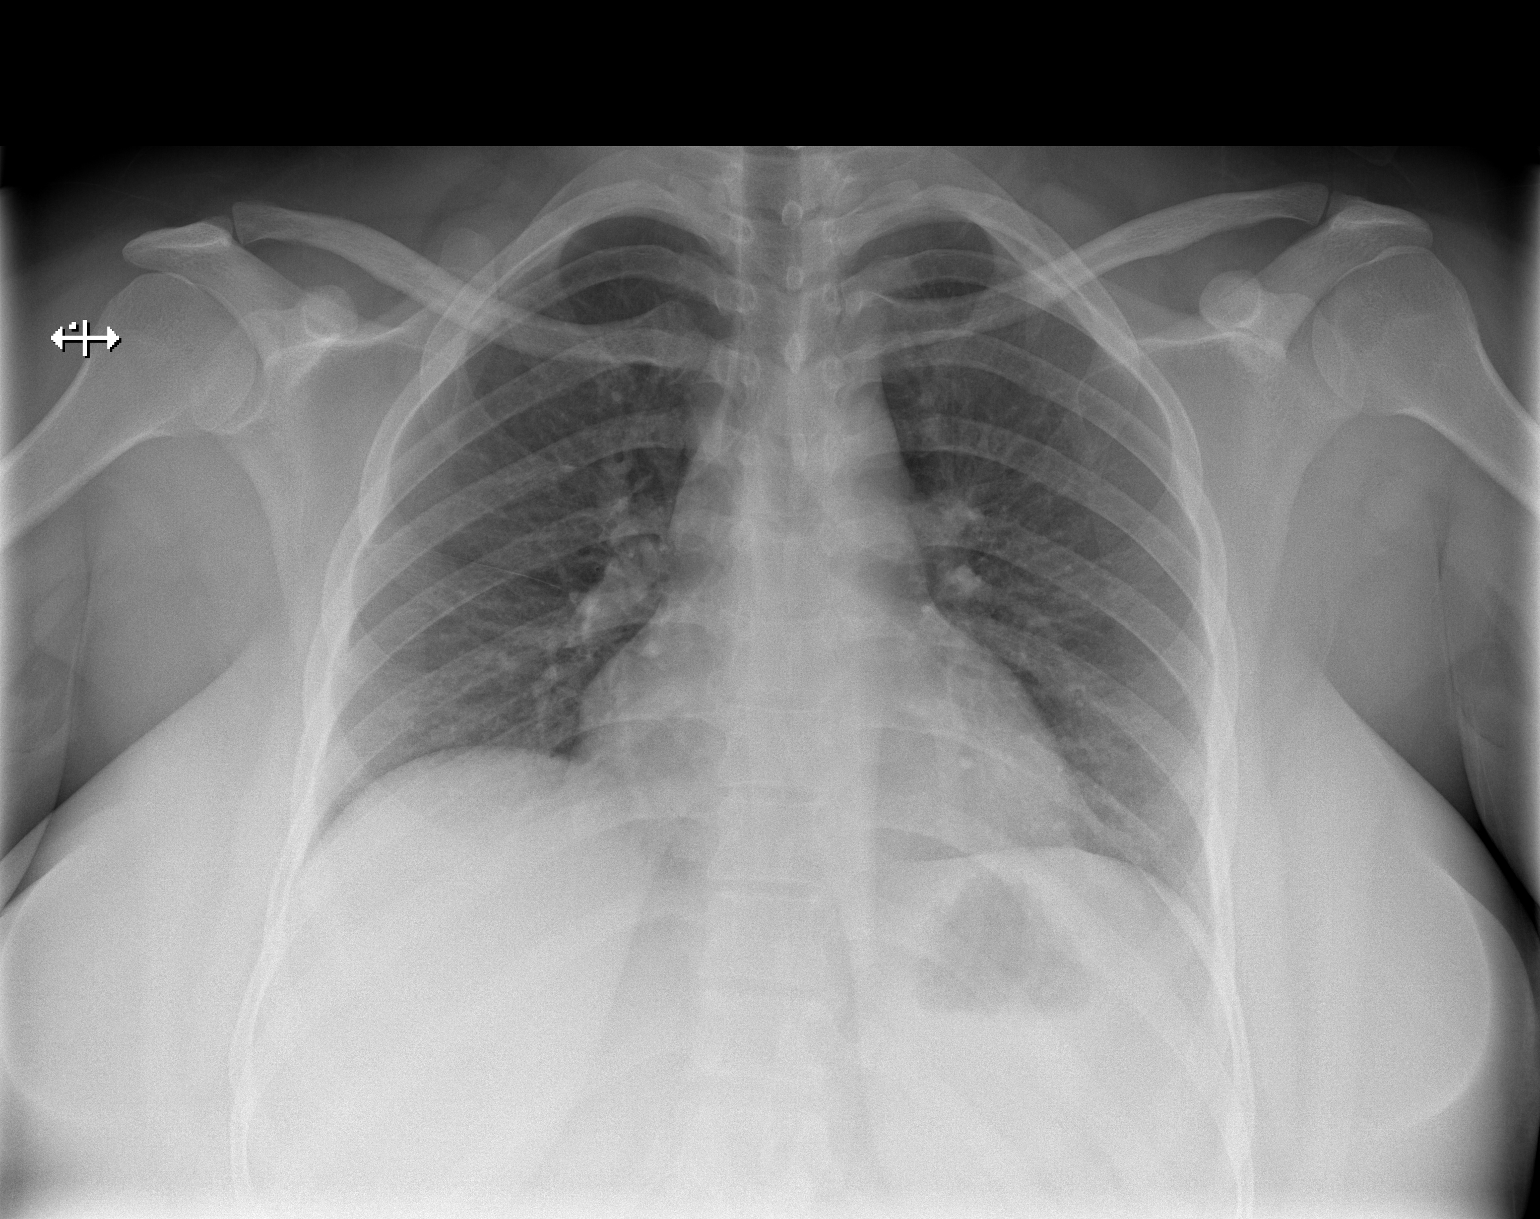

[w chest lat]
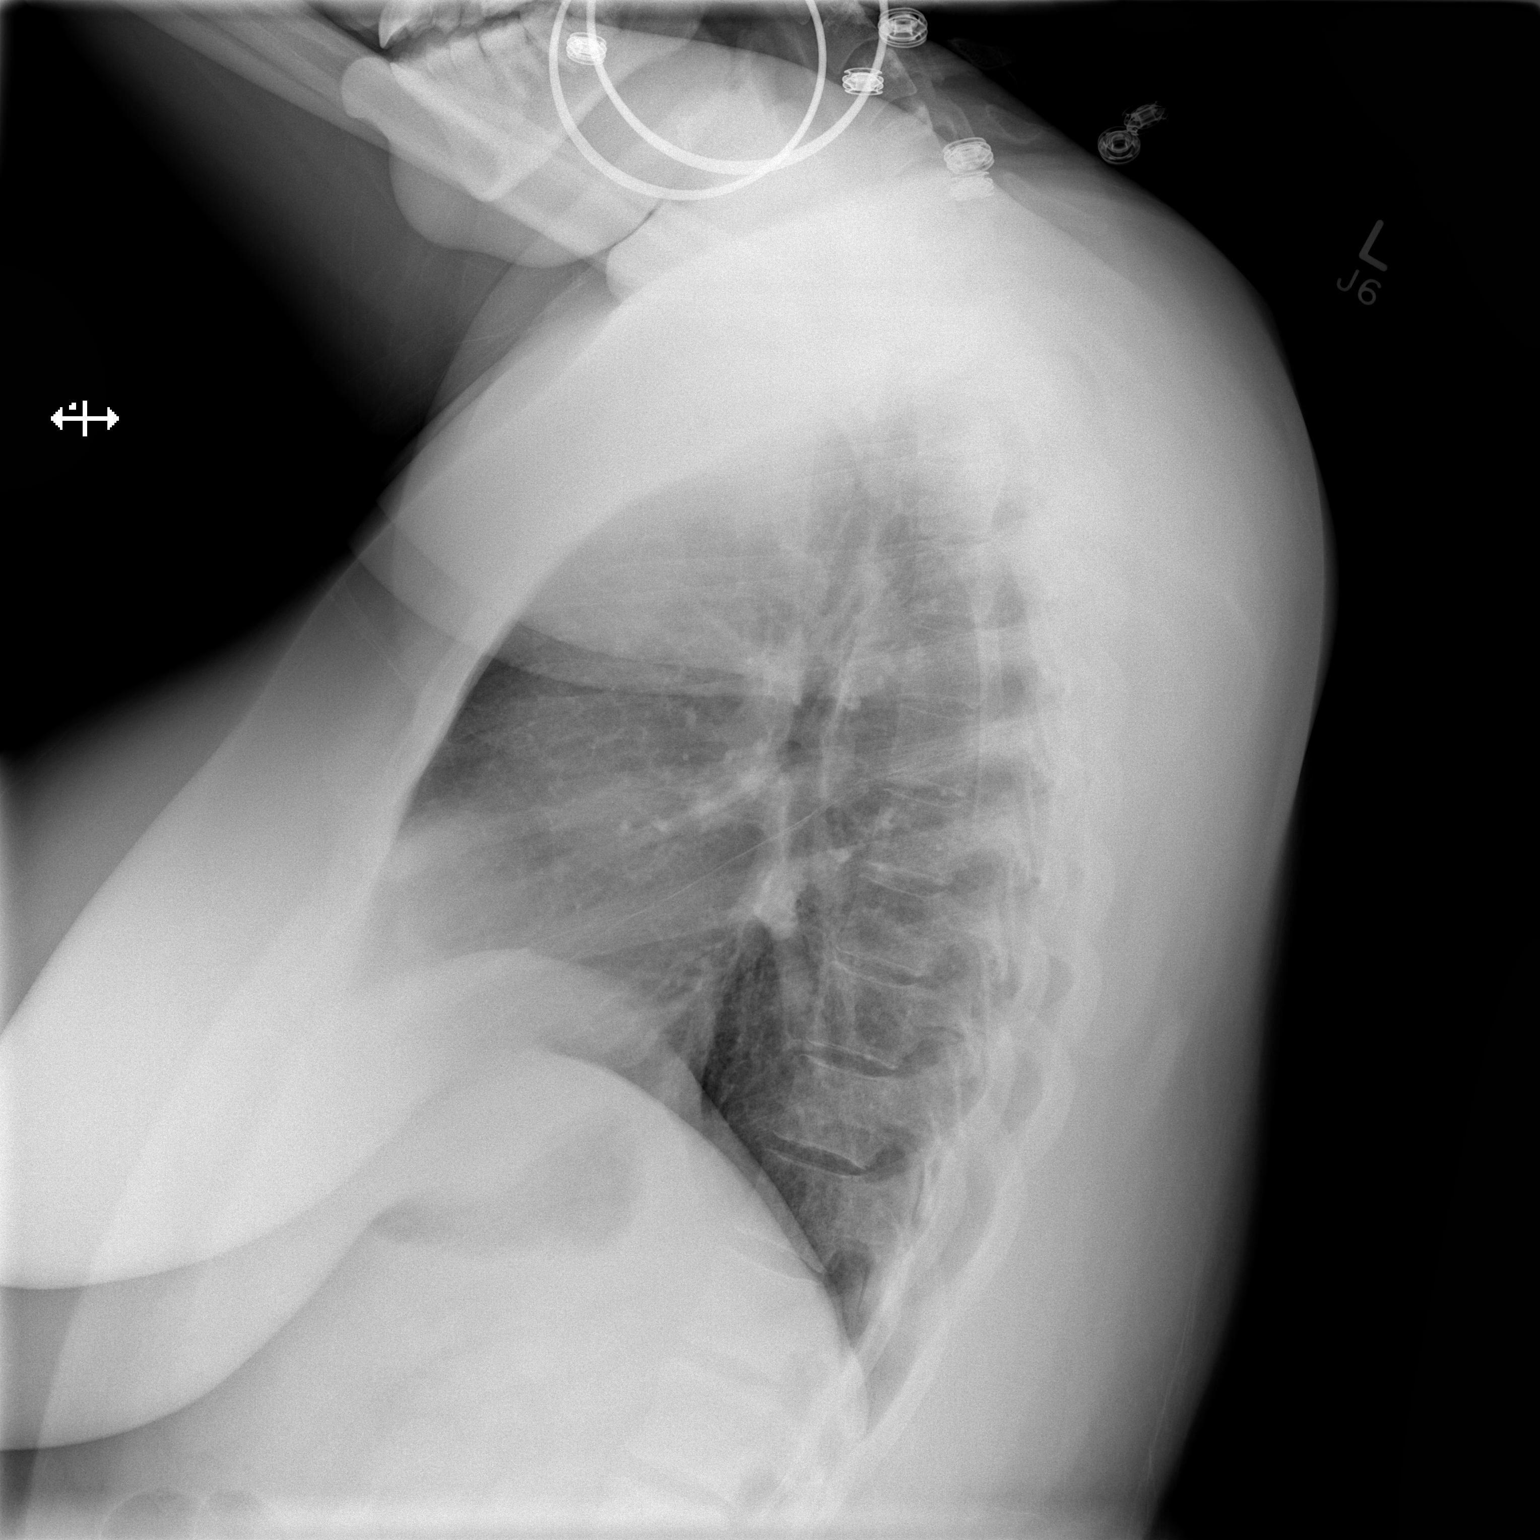

[2 of 2 positions shown; findings below may reference images not displayed]

FINDINGS: The heart size and mediastinal contours are within normal limits.
Both lungs are clear. The visualized skeletal structures are
unremarkable.
IMPRESSION: No active cardiopulmonary disease.

## 2019-06-15 ENCOUNTER — Encounter (HOSPITAL_COMMUNITY): Payer: Self-pay | Admitting: Emergency Medicine

## 2019-06-15 ENCOUNTER — Other Ambulatory Visit: Payer: Self-pay

## 2019-06-15 ENCOUNTER — Emergency Department (HOSPITAL_COMMUNITY)
Admission: EM | Admit: 2019-06-15 | Discharge: 2019-06-16 | Discharge status: Against Medical Advice | Payer: Self-pay | Attending: Emergency Medicine | Admitting: Emergency Medicine

## 2019-06-15 DIAGNOSIS — Z5321 Procedure and treatment not carried out due to patient leaving prior to being seen by health care provider: Secondary | ICD-10-CM | POA: Insufficient documentation

## 2019-06-15 NOTE — ED Triage Notes (Signed)
Patient with bilateral great toe pain.  She denies any injury to the toes.  Left great toe has an area of black at the top of the great toe.

## 2019-06-16 ENCOUNTER — Encounter (HOSPITAL_COMMUNITY): Payer: Self-pay | Admitting: Emergency Medicine

## 2019-06-16 ENCOUNTER — Ambulatory Visit (HOSPITAL_COMMUNITY)
Admission: EM | Admit: 2019-06-16 | Discharge: 2019-06-16 | Disposition: A | Discharge status: Home / Self Care | Payer: Self-pay | Attending: Urgent Care | Admitting: Urgent Care

## 2019-06-16 ENCOUNTER — Other Ambulatory Visit: Payer: Self-pay

## 2019-06-16 DIAGNOSIS — S90424A Blister (nonthermal), right lesser toe(s), initial encounter: Secondary | ICD-10-CM

## 2019-06-16 DIAGNOSIS — S90425A Blister (nonthermal), left lesser toe(s), initial encounter: Secondary | ICD-10-CM

## 2019-06-16 MED ORDER — NAPROXEN 500 MG PO TABS: 500.0000 mg | ORAL_TABLET | Freq: Two times a day (BID) | ORAL | 0 refills | Status: DC

## 2019-06-16 MED ORDER — MUPIROCIN 2 % EX OINT: 1.0000 "application " | TOPICAL_OINTMENT | Freq: Three times a day (TID) | CUTANEOUS | 0 refills | Status: DC

## 2019-06-16 NOTE — ED Notes (Signed)
Called pt x4 for room, no response. 

## 2019-06-16 NOTE — ED Notes (Signed)
No answer for vitals recheck. No answer in lobby

## 2019-06-16 NOTE — ED Provider Notes (Signed)
  MRN: [id number] DOB: Nov 17, 1990  Subjective:   Helen Dorsey is a 28 y.o. female presenting for 2-3 day history of bilateral worsening great toe swelling.  Patient states that this happened after she wore some tight shoes.  She cannot recall a particular inciting event but noticed the discoloration/bruising when she took her shoes off.  She denies fever, redness, warmth, drainage of pus or bleeding.  She has never had this particular problem before.  She He is not currently taking any medications and has no known food or drug allergies.  Denies past medical and surgical history.  ROS  Objective:   Vitals: BP (!) 147/94 (BP Location: Left Arm)   Pulse 82   Temp 98 F (36.7 C) (Oral)   Resp 18   SpO2 95%   Physical Exam Constitutional:      General: She is not in acute distress.    Appearance: Normal appearance. She is well-developed. She is not ill-appearing.  HENT:     Head: Normocephalic and atraumatic.     Nose: Nose normal.     Mouth/Throat:     Mouth: Mucous membranes are moist.     Pharynx: Oropharynx is clear.  Eyes:     General: No scleral icterus.    Extraocular Movements: Extraocular movements intact.     Pupils: Pupils are equal, round, and reactive to light.  Cardiovascular:     Rate and Rhythm: Normal rate.  Pulmonary:     Effort: Pulmonary effort is normal.  Musculoskeletal:       Feet:  Skin:    General: Skin is warm and dry.  Neurological:     General: No focal deficit present.     Mental Status: She is alert and oriented to person, place, and time.  Psychiatric:        Mood and Affect: Mood normal.        Behavior: Behavior normal.     PROCEDURE NOTE: I&D of blisters of great toes bilaterally Verbal consent obtained. Site cleansed with Betadine and alcohol prep pads. Incision/lanced with 18-gauge needle.  Discharge of serous fluid.  Cleansed and dressed with pressure dressing.   Assessment and Plan :   1. Toe blister without infection, left,  initial encounter   2. Toe blister without infection, right, initial encounter     Patient is to apply mupirocin, use naproxen for pain and inflammation.  Counseled on wound care. Counseled patient on potential for adverse effects with medications prescribed/recommended today, ER and return-to-clinic precautions discussed, patient verbalized understanding.     Jaynee Eagles, [place] 06/16/19 [phone]

## 2019-06-16 NOTE — ED Triage Notes (Signed)
Pt here for discoloration to tips of great toes after wearing shoes that were tight

## 2019-07-20 ENCOUNTER — Other Ambulatory Visit: Payer: Self-pay

## 2019-07-20 DIAGNOSIS — Z20822 Contact with and (suspected) exposure to covid-19: Secondary | ICD-10-CM

## 2019-07-22 LAB — NOVEL CORONAVIRUS, NAA: SARS-CoV-2, NAA: NOT DETECTED

## 2019-09-23 ENCOUNTER — Other Ambulatory Visit: Payer: Self-pay

## 2019-09-23 DIAGNOSIS — Z20822 Contact with and (suspected) exposure to covid-19: Secondary | ICD-10-CM

## 2019-09-26 LAB — NOVEL CORONAVIRUS, NAA: SARS-CoV-2, NAA: NOT DETECTED

## 2019-10-04 ENCOUNTER — Ambulatory Visit: Payer: HRSA Program | Attending: Internal Medicine

## 2019-10-04 DIAGNOSIS — Z20828 Contact with and (suspected) exposure to other viral communicable diseases: Secondary | ICD-10-CM | POA: Diagnosis not present

## 2019-10-04 DIAGNOSIS — Z20822 Contact with and (suspected) exposure to covid-19: Secondary | ICD-10-CM

## 2019-10-06 LAB — NOVEL CORONAVIRUS, NAA: SARS-CoV-2, NAA: NOT DETECTED

## 2020-01-11 ENCOUNTER — Ambulatory Visit: Payer: Self-pay | Attending: Internal Medicine

## 2020-01-11 DIAGNOSIS — Z20822 Contact with and (suspected) exposure to covid-19: Secondary | ICD-10-CM

## 2020-01-12 LAB — NOVEL CORONAVIRUS, NAA: SARS-CoV-2, NAA: NOT DETECTED

## 2020-01-12 LAB — SARS-COV-2, NAA 2 DAY TAT

## 2020-03-14 ENCOUNTER — Other Ambulatory Visit: Payer: Self-pay

## 2020-04-02 ENCOUNTER — Ambulatory Visit: Payer: Self-pay | Attending: Internal Medicine

## 2020-04-04 ENCOUNTER — Ambulatory Visit: Payer: HRSA Program | Attending: Internal Medicine

## 2020-04-04 DIAGNOSIS — Z20822 Contact with and (suspected) exposure to covid-19: Secondary | ICD-10-CM | POA: Insufficient documentation

## 2020-04-05 LAB — SARS-COV-2, NAA 2 DAY TAT

## 2020-04-05 LAB — NOVEL CORONAVIRUS, NAA: SARS-CoV-2, NAA: NOT DETECTED

## 2020-04-19 ENCOUNTER — Emergency Department (HOSPITAL_COMMUNITY)
Admission: EM | Admit: 2020-04-19 | Discharge: 2020-04-20 | Disposition: A | Discharge status: Home / Self Care | Payer: Self-pay | Attending: Emergency Medicine | Admitting: Emergency Medicine

## 2020-04-19 ENCOUNTER — Encounter (HOSPITAL_COMMUNITY): Payer: Self-pay | Admitting: Emergency Medicine

## 2020-04-19 ENCOUNTER — Other Ambulatory Visit: Payer: Self-pay

## 2020-04-19 DIAGNOSIS — L292 Pruritus vulvae: Secondary | ICD-10-CM | POA: Insufficient documentation

## 2020-04-19 DIAGNOSIS — Z5321 Procedure and treatment not carried out due to patient leaving prior to being seen by health care provider: Secondary | ICD-10-CM | POA: Insufficient documentation

## 2020-04-19 NOTE — ED Triage Notes (Signed)
Patient reports vaginal itching this week , denies dysuria or vaginal discharge , no fever or chills .

## 2020-04-20 ENCOUNTER — Encounter (HOSPITAL_COMMUNITY): Payer: Self-pay

## 2020-04-20 ENCOUNTER — Ambulatory Visit (HOSPITAL_COMMUNITY)
Admission: EM | Admit: 2020-04-20 | Discharge: 2020-04-20 | Disposition: A | Discharge status: Home / Self Care | Payer: Self-pay | Attending: Family Medicine | Admitting: Family Medicine

## 2020-04-20 ENCOUNTER — Other Ambulatory Visit: Payer: Self-pay

## 2020-04-20 DIAGNOSIS — Z3202 Encounter for pregnancy test, result negative: Secondary | ICD-10-CM

## 2020-04-20 DIAGNOSIS — N760 Acute vaginitis: Secondary | ICD-10-CM

## 2020-04-20 LAB — POC URINE PREG, ED: Preg Test, Ur: NEGATIVE

## 2020-04-20 MED ORDER — FLUCONAZOLE 150 MG PO TABS: 150.0000 mg | ORAL_TABLET | Freq: Once | ORAL | 0 refills | Status: AC

## 2020-04-20 NOTE — ED Triage Notes (Signed)
Pt presents to UC for vaginal itching x3 days. Pt denies recent abx use. Pt denies lower abdominal pain, discharge, foul smell, frequent urination, burning with urination. Pt states "it is a little sore from itching". Pt denies OTC treatments or relieving factors.

## 2020-04-20 NOTE — ED Provider Notes (Signed)
[facility]    CSN: [id number] Arrival date & time: 04/20/20  1634      History   Chief Complaint Chief Complaint  Patient presents with  . Vaginal Itching    HPI Helen Dorsey is a 29 y.o. female no significant past medical history presenting today for evaluation of vaginal itching.  Patient reports over the past 3 days she has had some itching swelling and tightness around her vulva.  She is also noticed a white milky discharge.  Initially had odor, but this has not persisted.  She denies any pain including denying abdominal pain or pelvic pain.  Denies nausea or vomiting.  Last menstrual cycle was 3 months ago.  Reports she typically has irregular cycle and is not atypical for her to skip a few months.  She is not on birth control.  Is sexually active, but denies any new partners.  HPI  History reviewed. No pertinent past medical history.  There are no problems to display for this patient.   History reviewed. No pertinent surgical history.  OB History   No obstetric history on file.      Home Medications    Prior to Admission medications   Medication Sig Start Date End Date Taking? Authorizing Provider  fluconazole (DIFLUCAN) 150 MG tablet Take 1 tablet (150 mg total) by mouth once for 1 dose. 04/20/20 04/20/20  Dontarious Schaum C, PA-C  mupirocin ointment (BACTROBAN) 2 % Apply 1 application topically 3 (three) times daily. 06/16/19   Wallis Bamberg, PA-C  naproxen (NAPROSYN) 500 MG tablet Take 1 tablet (500 mg total) by mouth 2 (two) times daily. 06/16/19   Wallis Bamberg, PA-C  ondansetron (ZOFRAN) 4 MG tablet Take 1 tablet (4 mg total) by mouth every 6 (six) hours as needed for nausea or vomiting. 01/30/14   Dione Booze, MD    Family History History reviewed. No pertinent family history.  Social History Social History   Tobacco Use  . Smoking status: Current Every Day Smoker  . Smokeless tobacco: Never Used  Substance Use Topics  . Alcohol use: Yes    Comment:  ocassionally  . Drug use: No     Allergies   Patient has no known allergies.   Review of Systems Review of Systems  Constitutional: Negative for fever.  Respiratory: Negative for shortness of breath.   Cardiovascular: Negative for chest pain.  Gastrointestinal: Negative for abdominal pain, diarrhea, nausea and vomiting.  Genitourinary: Positive for vaginal discharge. Negative for dysuria, flank pain, genital sores, hematuria, menstrual problem, vaginal bleeding and vaginal pain.  Musculoskeletal: Negative for back pain.  Skin: Negative for rash.  Neurological: Negative for dizziness, light-headedness and headaches.     Physical Exam Triage Vital Signs ED Triage Vitals [04/20/20 1738]  Enc Vitals Group     BP 132/68     Pulse Rate 88     Resp 16     Temp 98.2 F (36.8 C)     Temp Source Oral     SpO2 100 %     Weight      Height      Head Circumference      Peak Flow      Pain Score 0     Pain Loc      Pain Edu?      Excl. in GC?    No data found.  Updated Vital Signs BP 132/68 (BP Location: Right Arm)   Pulse 88   Temp 98.2 F (36.8 C) (  Oral)   Resp 16   SpO2 100%   Visual Acuity Right Eye Distance:   Left Eye Distance:   Bilateral Distance:    Right Eye Near:   Left Eye Near:    Bilateral Near:     Physical Exam Vitals and nursing note reviewed.  Constitutional:      Appearance: She is well-developed.     Comments: No acute distress  HENT:     Head: Normocephalic and atraumatic.     Nose: Nose normal.  Eyes:     Conjunctiva/sclera: Conjunctivae normal.  Cardiovascular:     Rate and Rhythm: Normal rate.  Pulmonary:     Effort: Pulmonary effort is normal. No respiratory distress.  Abdominal:     General: There is no distension.  Musculoskeletal:        General: Normal range of motion.     Cervical back: Neck supple.  Skin:    General: Skin is warm and dry.  Neurological:     Mental Status: She is alert and oriented to person, place,  and time.      UC Treatments / Results  Labs (all labs ordered are listed, but only abnormal results are displayed) Labs Reviewed  POC URINE PREG, ED  CERVICOVAGINAL ANCILLARY ONLY    EKG   Radiology No results found.  Procedures Procedures (including critical care time)  Medications Ordered in UC Medications - No data to display  Initial Impression / Assessment and Plan / UC Course  I have reviewed the triage vital signs and the nursing notes.  Pertinent labs & imaging results that were available during my care of the patient were reviewed by me and considered in my medical decision making (see chart for details).     Pregnancy test negative, vaginal swab pending.  Empirically treating for yeast today with Diflucan.  Will call with results and alter treatment as needed. Discussed strict return precautions. Patient verbalized understanding and is agreeable with plan.   Final Clinical Impressions(s) / UC Diagnoses   Final diagnoses:  Vaginitis and vulvovaginitis     Discharge Instructions     I am treating you for yeast infection, take 1 tablet of Diflucan today, may repeat in 3 to 4 days if still having symptoms and swab returns positive for yeast  We are testing you for Gonorrhea, Chlamydia, Trichomonas, Yeast and Bacterial Vaginosis. We will call you if anything is positive and let you know if you require any further treatment. Please inform partners of any positive results.   Please return if symptoms not improving with treatment, development of fever, nausea, vomiting, abdominal pain.    ED Prescriptions    Medication Sig Dispense Auth. Provider   fluconazole (DIFLUCAN) 150 MG tablet Take 1 tablet (150 mg total) by mouth once for 1 dose. 2 tablet Hajra Port, [place] C, PA-C     PDMP not reviewed this encounter.   Lew Dawes, [place] 04/20/20 1834

## 2020-04-20 NOTE — ED Notes (Signed)
Lwbs. 

## 2020-04-20 NOTE — Discharge Instructions (Addendum)
I am treating you for yeast infection, take 1 tablet of Diflucan today, may repeat in 3 to 4 days if still having symptoms and swab returns positive for yeast  We are testing you for Gonorrhea, Chlamydia, Trichomonas, Yeast and Bacterial Vaginosis. We will call you if anything is positive and let you know if you require any further treatment. Please inform partners of any positive results.   Please return if symptoms not improving with treatment, development of fever, nausea, vomiting, abdominal pain.

## 2020-04-23 LAB — CERVICOVAGINAL ANCILLARY ONLY
Bacterial Vaginitis (gardnerella): POSITIVE — AB
Candida Glabrata: NEGATIVE
Candida Vaginitis: POSITIVE — AB
Chlamydia: NEGATIVE
Comment: NEGATIVE
Comment: NEGATIVE
Comment: NEGATIVE
Comment: NEGATIVE
Comment: NEGATIVE
Comment: NORMAL
Neisseria Gonorrhea: NEGATIVE
Trichomonas: NEGATIVE

## 2020-04-24 ENCOUNTER — Telehealth (HOSPITAL_COMMUNITY): Payer: Self-pay | Admitting: Emergency Medicine

## 2020-04-24 MED ORDER — METRONIDAZOLE 500 MG PO TABS: 500.0000 mg | ORAL_TABLET | Freq: Two times a day (BID) | ORAL | 0 refills | Status: DC

## 2020-04-24 NOTE — Telephone Encounter (Signed)
Candida (yeast) is positive.  Prescription for fluconazole was given at the urgent care visit.     Bacterial vaginosis is positive. Pt needs treatment. Flagyl 500 mg BID x 7 days #14 no refills sent to patients pharmacy of choice.     Spoke with pt she verbalized understanding of results

## 2020-08-16 ENCOUNTER — Other Ambulatory Visit: Payer: Self-pay

## 2020-09-16 ENCOUNTER — Other Ambulatory Visit: Payer: Self-pay

## 2020-09-16 ENCOUNTER — Ambulatory Visit (HOSPITAL_COMMUNITY)
Admission: EM | Admit: 2020-09-16 | Discharge: 2020-09-16 | Disposition: A | Discharge status: Home / Self Care | Payer: Self-pay | Attending: Emergency Medicine | Admitting: Emergency Medicine

## 2020-09-16 ENCOUNTER — Encounter (HOSPITAL_COMMUNITY): Payer: Self-pay | Admitting: *Deleted

## 2020-09-16 DIAGNOSIS — N760 Acute vaginitis: Secondary | ICD-10-CM | POA: Insufficient documentation

## 2020-09-16 LAB — POC URINE PREG, ED: Preg Test, Ur: NEGATIVE

## 2020-09-16 NOTE — ED Triage Notes (Signed)
C/O vaginal irritation and slight vaginal irritation x approx 3 days without abd pain or fevers.

## 2020-09-16 NOTE — ED Provider Notes (Signed)
[facility]    CSN: [id number] Arrival date & time: 09/16/20  1057      History   Chief Complaint Chief Complaint  Patient presents with  . Vaginal Discharge    HPI Helen Dorsey is a 29 y.o. female.   Here today with 3 days of vaginal irritation and discharge. Denies flank pain, urinary sxs, rashes or lesions, abdominal pain, N/V/D, fever, known exposures to STIs. Has not attempted any treatments at home.      History reviewed. No pertinent past medical history.  There are no problems to display for this patient.   History reviewed. No pertinent surgical history.  OB History   No obstetric history on file.      Home Medications    Prior to Admission medications   Medication Sig Start Date End Date Taking? Authorizing Provider  metroNIDAZOLE (FLAGYL) 500 MG tablet Take 1 tablet (500 mg total) by mouth 2 (two) times daily. 04/24/20   Lamptey, Britta Mccreedy, MD  mupirocin ointment (BACTROBAN) 2 % Apply 1 application topically 3 (three) times daily. 06/16/19   Wallis Bamberg, PA-C  naproxen (NAPROSYN) 500 MG tablet Take 1 tablet (500 mg total) by mouth 2 (two) times daily. 06/16/19   Wallis Bamberg, PA-C  ondansetron (ZOFRAN) 4 MG tablet Take 1 tablet (4 mg total) by mouth every 6 (six) hours as needed for nausea or vomiting. 01/30/14   Dione Booze, MD    Family History History reviewed. No pertinent family history.  Social History Social History   Tobacco Use  . Smoking status: Current Every Day Smoker  . Smokeless tobacco: Never Used  Vaping Use  . Vaping Use: Never used  Substance Use Topics  . Alcohol use: Yes    Comment: occasionally  . Drug use: No     Allergies   Patient has no known allergies.   Review of Systems Review of Systems PER HPI   Physical Exam Triage Vital Signs ED Triage Vitals [09/16/20 1213]  Enc Vitals Group     BP 127/80     Pulse Rate 86     Resp 16     Temp 97.8 F (36.6 C)     Temp Source Oral     SpO2 98 %      Weight      Height      Head Circumference      Peak Flow      Pain Score 0     Pain Loc      Pain Edu?      Excl. in GC?    No data found.  Updated Vital Signs BP 127/80   Pulse 86   Temp 97.8 F (36.6 C) (Oral)   Resp 16   SpO2 98%   Visual Acuity Right Eye Distance:   Left Eye Distance:   Bilateral Distance:    Right Eye Near:   Left Eye Near:    Bilateral Near:     Physical Exam Vitals and nursing note reviewed.  Constitutional:      Appearance: Normal appearance. She is not ill-appearing.  HENT:     Head: Atraumatic.  Eyes:     Extraocular Movements: Extraocular movements intact.     Conjunctiva/sclera: Conjunctivae normal.  Cardiovascular:     Rate and Rhythm: Normal rate and regular rhythm.     Heart sounds: Normal heart sounds.  Pulmonary:     Effort: Pulmonary effort is normal.     Breath sounds: Normal breath sounds.  Abdominal:     General: Bowel sounds are normal. There is no distension.     Palpations: Abdomen is soft.     Tenderness: There is no abdominal tenderness. There is no guarding.  Genitourinary:    Comments: GU exam deferred, self swab performed Musculoskeletal:        General: Normal range of motion.     Cervical back: Normal range of motion and neck supple.  Skin:    General: Skin is warm and dry.  Neurological:     Mental Status: She is alert and oriented to person, place, and time.  Psychiatric:        Mood and Affect: Mood normal.        Thought Content: Thought content normal.        Judgment: Judgment normal.      UC Treatments / Results  Labs (all labs ordered are listed, but only abnormal results are displayed) Labs Reviewed  POC URINE PREG, ED  CERVICOVAGINAL ANCILLARY ONLY    EKG   Radiology No results found.  Procedures Procedures (including critical care time)  Medications Ordered in UC Medications - No data to display  Initial Impression / Assessment and Plan / UC Course  I have reviewed the  triage vital signs and the nursing notes.  Pertinent labs & imaging results that were available during my care of the patient were reviewed by me and considered in my medical decision making (see chart for details).     Aptima swab pending, discussed supportive OTC care and goodvaginal hygiene practices. Treat based on swab results.   Final Clinical Impressions(s) / UC Diagnoses   Final diagnoses:  Acute vaginitis   Discharge Instructions   None    ED Prescriptions    None     PDMP not reviewed this encounter.   Particia Nearing, [place] 09/16/20 [phone]

## 2020-09-17 LAB — CERVICOVAGINAL ANCILLARY ONLY
Bacterial Vaginitis (gardnerella): POSITIVE — AB
Candida Glabrata: NEGATIVE
Candida Vaginitis: POSITIVE — AB
Chlamydia: NEGATIVE
Comment: NEGATIVE
Comment: NEGATIVE
Comment: NEGATIVE
Comment: NEGATIVE
Comment: NEGATIVE
Comment: NORMAL
Neisseria Gonorrhea: NEGATIVE
Trichomonas: NEGATIVE

## 2020-09-18 ENCOUNTER — Telehealth (HOSPITAL_COMMUNITY): Payer: Self-pay | Admitting: Emergency Medicine

## 2020-09-18 MED ORDER — FLUCONAZOLE 150 MG PO TABS: 150.0000 mg | ORAL_TABLET | Freq: Once | ORAL | 0 refills | Status: AC

## 2020-09-18 MED ORDER — METRONIDAZOLE 500 MG PO TABS: 500.0000 mg | ORAL_TABLET | Freq: Two times a day (BID) | ORAL | 0 refills | Status: DC

## 2020-11-08 ENCOUNTER — Other Ambulatory Visit: Payer: Self-pay

## 2020-11-08 ENCOUNTER — Inpatient Hospital Stay (HOSPITAL_COMMUNITY)
Admission: EM | Admit: 2020-11-08 | Discharge: 2020-11-11 | DRG: 638 | Disposition: A | Discharge status: Home / Self Care | Payer: BC Managed Care – PPO | Attending: Internal Medicine | Admitting: Internal Medicine

## 2020-11-08 DIAGNOSIS — R000 Tachycardia, unspecified: Secondary | ICD-10-CM | POA: Diagnosis not present

## 2020-11-08 DIAGNOSIS — E1110 Type 2 diabetes mellitus with ketoacidosis without coma: Secondary | ICD-10-CM | POA: Diagnosis not present

## 2020-11-08 DIAGNOSIS — E1010 Type 1 diabetes mellitus with ketoacidosis without coma: Secondary | ICD-10-CM | POA: Diagnosis not present

## 2020-11-08 DIAGNOSIS — E876 Hypokalemia: Secondary | ICD-10-CM | POA: Diagnosis not present

## 2020-11-08 DIAGNOSIS — K219 Gastro-esophageal reflux disease without esophagitis: Secondary | ICD-10-CM | POA: Diagnosis not present

## 2020-11-08 DIAGNOSIS — R1013 Epigastric pain: Secondary | ICD-10-CM | POA: Diagnosis not present

## 2020-11-08 DIAGNOSIS — Z20822 Contact with and (suspected) exposure to covid-19: Secondary | ICD-10-CM | POA: Diagnosis not present

## 2020-11-08 DIAGNOSIS — F17200 Nicotine dependence, unspecified, uncomplicated: Secondary | ICD-10-CM | POA: Diagnosis not present

## 2020-11-08 DIAGNOSIS — Z6841 Body Mass Index (BMI) 40.0 and over, adult: Secondary | ICD-10-CM

## 2020-11-08 HISTORY — DX: Type 2 diabetes mellitus with ketoacidosis without coma: E11.10

## 2020-11-08 LAB — URINALYSIS, ROUTINE W REFLEX MICROSCOPIC
Bilirubin Urine: NEGATIVE
Glucose, UA: >=500 mg/dL — AB
Ketones, ur: 80 mg/dL — AB
Nitrite: NEGATIVE
Protein, ur: NEGATIVE mg/dL
Specific Gravity, Urine: 1.03 (ref 1.005–1.030)
pH: 5 (ref 5.0–8.0)

## 2020-11-08 LAB — COMPREHENSIVE METABOLIC PANEL
ALT: 57 U/L — ABNORMAL HIGH (ref 0–44)
AST: 30 U/L (ref 15–41)
Albumin: 4.2 g/dL (ref 3.5–5.0)
Alkaline Phosphatase: 154 U/L — ABNORMAL HIGH (ref 38–126)
Anion gap: 20 — ABNORMAL HIGH (ref 5–15)
BUN: 9 mg/dL (ref 6–20)
CO2: 18 mmol/L — ABNORMAL LOW (ref 22–32)
Calcium: 9.4 mg/dL (ref 8.9–10.3)
Chloride: 85 mmol/L — ABNORMAL LOW (ref 98–111)
Creatinine, Ser: 1.29 mg/dL — ABNORMAL HIGH (ref 0.44–1.00)
GFR, Estimated: 57 mL/min — ABNORMAL LOW (ref >60)
Glucose, Bld: 851 mg/dL (ref 70–99)
Potassium: 4.3 mmol/L (ref 3.5–5.1)
Sodium: 123 mmol/L — ABNORMAL LOW (ref 135–145)
Total Bilirubin: 1.5 mg/dL — ABNORMAL HIGH (ref 0.3–1.2)
Total Protein: 8.4 g/dL — ABNORMAL HIGH (ref 6.5–8.1)

## 2020-11-08 LAB — LIPID PANEL
Cholesterol: 142 mg/dL (ref 0–200)
HDL: 37 mg/dL — ABNORMAL LOW (ref >40)
LDL Cholesterol: 79 mg/dL (ref 0–99)
Total CHOL/HDL Ratio: 3.8 RATIO
Triglycerides: 129 mg/dL (ref <150)
VLDL: 26 mg/dL (ref 0–40)

## 2020-11-08 LAB — BASIC METABOLIC PANEL
Anion gap: 17 — ABNORMAL HIGH (ref 5–15)
BUN: 7 mg/dL (ref 6–20)
CO2: 20 mmol/L — ABNORMAL LOW (ref 22–32)
Calcium: 9 mg/dL (ref 8.9–10.3)
Chloride: 96 mmol/L — ABNORMAL LOW (ref 98–111)
Creatinine, Ser: 0.85 mg/dL (ref 0.44–1.00)
GFR, Estimated: >60 mL/min (ref >60)
Glucose, Bld: 224 mg/dL — ABNORMAL HIGH (ref 70–99)
Potassium: 3.4 mmol/L — ABNORMAL LOW (ref 3.5–5.1)
Sodium: 133 mmol/L — ABNORMAL LOW (ref 135–145)

## 2020-11-08 LAB — CBC WITH DIFFERENTIAL/PLATELET
Abs Immature Granulocytes: 0.03 10*3/uL (ref 0.00–0.07)
Basophils Absolute: 0 10*3/uL (ref 0.0–0.1)
Basophils Relative: 1 %
Eosinophils Absolute: 0 10*3/uL (ref 0.0–0.5)
Eosinophils Relative: 0 %
HCT: 42.7 % (ref 36.0–46.0)
Hemoglobin: 14.6 g/dL (ref 12.0–15.0)
Immature Granulocytes: 0 %
Lymphocytes Relative: 17 %
Lymphs Abs: 1.3 10*3/uL (ref 0.7–4.0)
MCH: 26.5 pg (ref 26.0–34.0)
MCHC: 34.2 g/dL (ref 30.0–36.0)
MCV: 77.5 fL — ABNORMAL LOW (ref 80.0–100.0)
Monocytes Absolute: 0.8 10*3/uL (ref 0.1–1.0)
Monocytes Relative: 11 %
Neutro Abs: 5.4 10*3/uL (ref 1.7–7.7)
Neutrophils Relative %: 71 %
Platelets: 250 10*3/uL (ref 150–400)
RBC: 5.51 MIL/uL — ABNORMAL HIGH (ref 3.87–5.11)
RDW: 14.7 % (ref 11.5–15.5)
WBC: 7.6 10*3/uL (ref 4.0–10.5)
nRBC: 0 % (ref 0.0–0.2)

## 2020-11-08 LAB — CBG MONITORING, ED
Glucose-Capillary: 227 mg/dL — ABNORMAL HIGH (ref 70–99)
Glucose-Capillary: 231 mg/dL — ABNORMAL HIGH (ref 70–99)
Glucose-Capillary: 233 mg/dL — ABNORMAL HIGH (ref 70–99)
Glucose-Capillary: 300 mg/dL — ABNORMAL HIGH (ref 70–99)
Glucose-Capillary: 317 mg/dL — ABNORMAL HIGH (ref 70–99)
Glucose-Capillary: 484 mg/dL — ABNORMAL HIGH (ref 70–99)
Glucose-Capillary: >600 mg/dL (ref 70–99)

## 2020-11-08 LAB — I-STAT VENOUS BLOOD GAS, ED
Acid-base deficit: 5 mmol/L — ABNORMAL HIGH (ref 0.0–2.0)
Bicarbonate: 22.1 mmol/L (ref 20.0–28.0)
Calcium, Ion: 1.12 mmol/L — ABNORMAL LOW (ref 1.15–1.40)
HCT: 45 % (ref 36.0–46.0)
Hemoglobin: 15.3 g/dL — ABNORMAL HIGH (ref 12.0–15.0)
O2 Saturation: 95 %
Potassium: 4.7 mmol/L (ref 3.5–5.1)
Sodium: 124 mmol/L — ABNORMAL LOW (ref 135–145)
TCO2: 24 mmol/L (ref 22–32)
pCO2, Ven: 47.2 mmHg (ref 44.0–60.0)
pH, Ven: 7.279 (ref 7.250–7.430)
pO2, Ven: 85 mmHg — ABNORMAL HIGH (ref 32.0–45.0)

## 2020-11-08 LAB — HEMOGLOBIN A1C
Hgb A1c MFr Bld: 13 % — ABNORMAL HIGH (ref 4.8–5.6)
Mean Plasma Glucose: 326.4 mg/dL

## 2020-11-08 LAB — I-STAT BETA HCG BLOOD, ED (MC, WL, AP ONLY): I-stat hCG, quantitative: <5 m[IU]/mL (ref <5)

## 2020-11-08 LAB — HIV ANTIBODY (ROUTINE TESTING W REFLEX): HIV Screen 4th Generation wRfx: NONREACTIVE

## 2020-11-08 LAB — BETA-HYDROXYBUTYRIC ACID
Beta-Hydroxybutyric Acid: 2.47 mmol/L — ABNORMAL HIGH (ref 0.05–0.27)
Beta-Hydroxybutyric Acid: 2.47 mmol/L — ABNORMAL HIGH (ref 0.05–0.27)

## 2020-11-08 LAB — LIPASE, BLOOD: Lipase: 28 U/L (ref 11–51)

## 2020-11-08 MED ORDER — LIDOCAINE VISCOUS HCL 2 % MT SOLN
15.0000 mL | Freq: Once | OROMUCOSAL | Status: AC
  Administered 2020-11-08: 15 mL via ORAL
  Filled 2020-11-08: qty 15

## 2020-11-08 MED ORDER — ALUM & MAG HYDROXIDE-SIMETH 200-200-20 MG/5ML PO SUSP
30.0000 mL | Freq: Once | ORAL | Status: AC
  Administered 2020-11-08: 30 mL via ORAL
  Filled 2020-11-08: qty 30

## 2020-11-08 MED ORDER — POTASSIUM CHLORIDE 10 MEQ/100ML IV SOLN
10.0000 meq | INTRAVENOUS | Status: DC
  Filled 2020-11-08: qty 100

## 2020-11-08 MED ORDER — PANTOPRAZOLE SODIUM 40 MG PO TBEC
40.0000 mg | DELAYED_RELEASE_TABLET | Freq: Every day | ORAL | Status: DC
  Administered 2020-11-08 – 2020-11-11 (×4): 40 mg via ORAL
  Filled 2020-11-08 (×4): qty 1

## 2020-11-08 MED ORDER — POTASSIUM CHLORIDE 10 MEQ/100ML IV SOLN
10.0000 meq | INTRAVENOUS | Status: AC
  Administered 2020-11-08 (×2): 10 meq via INTRAVENOUS
  Filled 2020-11-08: qty 100

## 2020-11-08 MED ORDER — OMEPRAZOLE 40 MG PO CPDR: 40.0000 mg | DELAYED_RELEASE_CAPSULE | Freq: Every day | ORAL | 2 refills | Status: DC

## 2020-11-08 MED ORDER — DEXTROSE 50 % IV SOLN: 0.0000 mL | INTRAVENOUS | Status: DC | PRN

## 2020-11-08 MED ORDER — DEXTROSE IN LACTATED RINGERS 5 % IV SOLN: INTRAVENOUS | Status: DC

## 2020-11-08 MED ORDER — METOCLOPRAMIDE HCL 5 MG/ML IJ SOLN: 10.0000 mg | Freq: Four times a day (QID) | INTRAMUSCULAR | Status: DC | PRN

## 2020-11-08 MED ORDER — INSULIN REGULAR(HUMAN) IN NACL 100-0.9 UT/100ML-% IV SOLN
INTRAVENOUS | Status: DC
  Administered 2020-11-08: 15 [IU]/h via INTRAVENOUS
  Filled 2020-11-08 (×2): qty 100

## 2020-11-08 MED ORDER — LACTATED RINGERS IV BOLUS
20.0000 mL/kg | Freq: Once | INTRAVENOUS | Status: AC
  Administered 2020-11-08: 2086 mL via INTRAVENOUS

## 2020-11-08 MED ORDER — LACTATED RINGERS IV SOLN: INTRAVENOUS | Status: DC

## 2020-11-08 NOTE — ED Triage Notes (Signed)
Pt here from home with c/o epigastric pain , thinks that it is heart burn , on goingh for 1 week tried tums at home with no relief

## 2020-11-08 NOTE — H&P (Signed)
History and Physical    Helen Dorsey YBO:[id number] DOB: Oct 08, 1991 DOA: 11/08/2020  PCP: Ob/Gyn, [place] (Confirm with patient/family/NH records and if not entered, this has to be entered at [facility] point of entry) Patient coming from: Home  I have personally briefly reviewed patient's old medical records in [facility]  Chief Complaint: Abdominal pain, feeling thirsty  HPI: Birda Didonato is a 30 y.o. female with no significant past medical history presented with epigastric pain for 1 week.  Patient has been experiencing burning/aching like epigastric pain every time after eating or drinking regular food. Symptoms usually last 1 to 2 hours and sometimes also had the same pain at night. Occasionally accompanied with feeling nauseous no vomiting of food she just ate. She has been using TUMS with minimum help. She also noticed she has been feeling more thirsty lately had not had to drink more than usual amount of liquid daytime still feels thirsty. She also endorsed feeling tired whole week last week. ED Course: DKA, glucose 851, bicarb 18. Review of Systems: As per HPI otherwise 14 point review of systems negative.    No past medical history on file.  No past surgical history on file.   reports that she has been smoking. She has never used smokeless tobacco. She reports current alcohol use. She reports that she does not use drugs.  No Known Allergies  No diabetes right in the family.    Prior to Admission medications   Medication Sig Start Date End Date Taking? Authorizing Provider  omeprazole (PRILOSEC) 40 MG capsule Take 1 capsule (40 mg total) by mouth daily. 11/08/20  Yes Lorelee New, PA-C  metroNIDAZOLE (FLAGYL) 500 MG tablet Take 1 tablet (500 mg total) by mouth 2 (two) times daily. 09/18/20   LampteyBritta Mccreedy, MD  mupirocin ointment (BACTROBAN) 2 % Apply 1 application topically 3 (three) times daily. 06/16/19   Wallis Bamberg, PA-C  naproxen (NAPROSYN) 500 MG  tablet Take 1 tablet (500 mg total) by mouth 2 (two) times daily. 06/16/19   Wallis Bamberg, PA-C  ondansetron (ZOFRAN) 4 MG tablet Take 1 tablet (4 mg total) by mouth every 6 (six) hours as needed for nausea or vomiting. 01/30/14   Dione Booze, MD    Physical Exam: Vitals:   11/08/20 1225 11/08/20 1440  BP: 116/76 (!) 141/117  Pulse: (!) 119 (!) 108  Resp: 18 (!) 22  Temp: 97.9 F (36.6 C) 98.7 F (37.1 C)  TempSrc: Oral   SpO2: 99% 99%  Weight:  104.3 kg    Constitutional: NAD, calm, comfortable Vitals:   11/08/20 1225 11/08/20 1440  BP: 116/76 (!) 141/117  Pulse: (!) 119 (!) 108  Resp: 18 (!) 22  Temp: 97.9 F (36.6 C) 98.7 F (37.1 C)  TempSrc: Oral   SpO2: 99% 99%  Weight:  104.3 kg   Eyes: PERRL, lids and conjunctivae normal ENMT: Mucous membranes are dry. Posterior pharynx clear of any exudate or lesions.Normal dentition.  Neck: normal, supple, no masses, no thyromegaly Respiratory: clear to auscultation bilaterally, no wheezing, no crackles. Normal respiratory effort. No accessory muscle use.  Cardiovascular: Regular rate and rhythm, no murmurs / rubs / gallops. No extremity edema. 2+ pedal pulses. No carotid bruits.  Abdomen: no tenderness, no masses palpated. No hepatosplenomegaly. Bowel sounds positive.  Musculoskeletal: no clubbing / cyanosis. No joint deformity upper and lower extremities. Good ROM, no contractures. Normal muscle tone.  Skin: no rashes, lesions, ulcers. No induration Neurologic: CN 2-12 grossly intact.  Sensation intact, DTR normal. Strength 5/5 in all 4.  Psychiatric: Normal judgment and insight. Alert and oriented x 3. Normal mood.     Labs on Admission: I have personally reviewed following labs and imaging studies  CBC: Recent Labs  Lab 11/08/20 1237  WBC 7.6  NEUTROABS 5.4  HGB 14.6  HCT 42.7  MCV 77.5*  PLT 250   Basic Metabolic Panel: Recent Labs  Lab 11/08/20 1237  NA 123*  K 4.3  CL 85*  CO2 18*  GLUCOSE 851*  BUN 9   CREATININE 1.29*  CALCIUM 9.4   GFR: CrCl cannot be calculated (Unknown ideal weight.). Liver Function Tests: Recent Labs  Lab 11/08/20 1237  AST 30  ALT 57*  ALKPHOS 154*  BILITOT 1.5*  PROT 8.4*  ALBUMIN 4.2   Recent Labs  Lab 11/08/20 1237  LIPASE 28   No results for input(s): AMMONIA in the last 168 hours. Coagulation Profile: No results for input(s): INR, PROTIME in the last 168 hours. Cardiac Enzymes: No results for input(s): CKTOTAL, CKMB, CKMBINDEX, TROPONINI in the last 168 hours. BNP (last 3 results) No results for input(s): PROBNP in the last 8760 hours. HbA1C: No results for input(s): HGBA1C in the last 72 hours. CBG: Recent Labs  Lab 11/08/20 1512  GLUCAP >600*   Lipid Profile: No results for input(s): CHOL, HDL, LDLCALC, TRIG, CHOLHDL, LDLDIRECT in the last 72 hours. Thyroid Function Tests: No results for input(s): TSH, T4TOTAL, FREET4, T3FREE, THYROIDAB in the last 72 hours. Anemia Panel: No results for input(s): VITAMINB12, FOLATE, FERRITIN, TIBC, IRON, RETICCTPCT in the last 72 hours. Urine analysis:    Component Value Date/Time   COLORURINE STRAW (A) 11/08/2020 1316   APPEARANCEUR HAZY (A) 11/08/2020 1316   LABSPEC 1.030 11/08/2020 1316   PHURINE 5.0 11/08/2020 1316   GLUCOSEU >=500 (A) 11/08/2020 1316   HGBUR MODERATE (A) 11/08/2020 1316   BILIRUBINUR NEGATIVE 11/08/2020 1316   KETONESUR 80 (A) 11/08/2020 1316   PROTEINUR NEGATIVE 11/08/2020 1316   UROBILINOGEN 0.2 01/30/2014 0052   NITRITE NEGATIVE 11/08/2020 1316   LEUKOCYTESUR SMALL (A) 11/08/2020 1316    Radiological Exams on Admission: No results found.  EKG: Independently reviewed. Sinus tachycardia, nonspecific ST changes  Assessment/Plan Active Problems:   DKA (diabetic ketoacidosis) (HCC)  (please populate well all problems here in Problem List. (For example, if patient is on BP meds at home and you resume or decide to hold them, it is a problem that needs to be her.  Same for CAD, COPD, HLD and so on)  DKA and new onset of diabetes -Insulin drip -BMP Q4H -Fluid and switch to IVF with Glu according to DKA protocol -Pregnancy test negative -Baseline ProInsulin and C peptide -Outpatient endocrinology F/U  Epigastric pain -Ulcer versus diabetic gastropathy, Lipase normal -Trial of a PPI, outpatient GI follow-up -Discussed with patient, who understood and agreed with plan.  Morbid obesity -Calorie control  DVT prophylaxis: SCD Code Status: Full Code Family Communication: None at bedside Disposition Plan: Expect 1-2 day's hospital stay Consults called: None Admission status: PCU   Emeline General MD [facility] Pager [phone]  11/08/2020, 3:34 PM

## 2020-11-08 NOTE — ED Provider Notes (Signed)
[facility] EMERGENCY DEPARTMENT Provider Note   CSN: [id number] Arrival date & time: 11/08/20  1215     History No chief complaint on file.   Helen Dorsey is a 30 y.o. female with no past medical history presents the ED with a 1 week history of epigastric discomfort.   On my examination, patient reports that for the past week she has been experiencing epigastric burning discomfort described as GERD.  She states that she has been trying to drink juice, with little relief.  I informed her that citrus foods and juices can exacerbate GERD symptoms.  She was unaware.  She also states that her reflux symptoms are worse with lying flat and she has a metallic taste in the morning.  She denies any chest pain, shortness of breath, pleuritic symptoms, history of clots or clotting disorder, unilateral extremity swelling or edema, lower abdominal pain, urinary symptoms, fevers or chills, inability to eat or drink, significant NSAID/aspirin use, melena, or other changes in her bowel habits.  Patient reports that she will intermittently smoke tobacco.  She will also intermittently binge drink alcohol, but none in the past few weeks.   On subsequent evaluation, patient states that she has been feeling fatigued recently with polydipsia and polyuria.  She also endorses slight weight loss.  She denies any family history of diabetes.  HPI     No past medical history on file.  There are no problems to display for this patient.   No past surgical history on file.   OB History   No obstetric history on file.     No family history on file.  Social History   Tobacco Use  . Smoking status: Current Every Day Smoker  . Smokeless tobacco: Never Used  Vaping Use  . Vaping Use: Never used  Substance Use Topics  . Alcohol use: Yes    Comment: occasionally  . Drug use: No    Home Medications Prior to Admission medications   Medication Sig Start Date End Date Taking?  Authorizing Provider  omeprazole (PRILOSEC) 40 MG capsule Take 1 capsule (40 mg total) by mouth daily. 11/08/20  Yes Lorelee New, PA-C  metroNIDAZOLE (FLAGYL) 500 MG tablet Take 1 tablet (500 mg total) by mouth 2 (two) times daily. 09/18/20   LampteyBritta Mccreedy, MD  mupirocin ointment (BACTROBAN) 2 % Apply 1 application topically 3 (three) times daily. 06/16/19   Wallis Bamberg, PA-C  naproxen (NAPROSYN) 500 MG tablet Take 1 tablet (500 mg total) by mouth 2 (two) times daily. 06/16/19   Wallis Bamberg, PA-C  ondansetron (ZOFRAN) 4 MG tablet Take 1 tablet (4 mg total) by mouth every 6 (six) hours as needed for nausea or vomiting. 01/30/14   Dione Booze, MD    Allergies    Patient has no known allergies.  Review of Systems   Review of Systems  All other systems reviewed and are negative.   Physical Exam Updated Vital Signs BP (!) 141/117   Pulse (!) 108   Temp 98.7 F (37.1 C)   Resp (!) 22   SpO2 99%   Physical Exam Vitals and nursing note reviewed. Exam conducted with a chaperone present.  Constitutional:      General: She is not in acute distress.    Appearance: Normal appearance. She is not ill-appearing.  HENT:     Head: Normocephalic and atraumatic.     Mouth/Throat:     Mouth: Mucous membranes are dry.  Eyes:  General: No scleral icterus.    Conjunctiva/sclera: Conjunctivae normal.  Cardiovascular:     Rate and Rhythm: Regular rhythm. Tachycardia present.     Pulses: Normal pulses.  Pulmonary:     Effort: Pulmonary effort is normal. No respiratory distress.     Breath sounds: Normal breath sounds. No wheezing or rales.  Abdominal:     General: Abdomen is flat. There is no distension.     Palpations: Abdomen is soft.     Tenderness: There is no abdominal tenderness.     Comments: No tenderness.  No overlying skin changes.  Soft, nondistended.  Musculoskeletal:     Cervical back: Normal range of motion.  Skin:    General: Skin is dry.  Neurological:     Mental  Status: She is alert and oriented to person, place, and time.     GCS: GCS eye subscore is 4. GCS verbal subscore is 5. GCS motor subscore is 6.  Psychiatric:        Mood and Affect: Mood normal.        Behavior: Behavior normal.        Thought Content: Thought content normal.     ED Results / Procedures / Treatments   Labs (all labs ordered are listed, but only abnormal results are displayed) Labs Reviewed  COMPREHENSIVE METABOLIC PANEL - Abnormal; Notable for the following components:      Result Value   Sodium 123 (*)    Chloride 85 (*)    CO2 18 (*)    Glucose, Bld 851 (*)    Creatinine, Ser 1.29 (*)    Total Protein 8.4 (*)    ALT 57 (*)    Alkaline Phosphatase 154 (*)    Total Bilirubin 1.5 (*)    GFR, Estimated 57 (*)    Anion gap 20 (*)    All other components within normal limits  CBC WITH DIFFERENTIAL/PLATELET - Abnormal; Notable for the following components:   RBC 5.51 (*)    MCV 77.5 (*)    All other components within normal limits  URINALYSIS, ROUTINE W REFLEX MICROSCOPIC - Abnormal; Notable for the following components:   Color, Urine STRAW (*)    APPearance HAZY (*)    Glucose, UA >=500 (*)    Hgb urine dipstick MODERATE (*)    Ketones, ur 80 (*)    Leukocytes,Ua SMALL (*)    Bacteria, UA RARE (*)    All other components within normal limits  LIPASE, BLOOD  BETA-HYDROXYBUTYRIC ACID  BETA-HYDROXYBUTYRIC ACID  I-STAT BETA HCG BLOOD, ED (MC, WL, AP ONLY)  CBG MONITORING, ED  I-STAT VENOUS BLOOD GAS, ED    EKG EKG Interpretation  Date/Time:  Thursday November 08 2020 12:23:35 EST Ventricular Rate:  119 PR Interval:  126 QRS Duration: 94 QT Interval:  350 QTC Calculation: 492 R Axis:   66 Text Interpretation: Sinus tachycardia Nonspecific T wave abnormality Abnormal ECG Sinus tachycardia, non specific T wave changes, no STEMI Confirmed by Coralee Pesa [phone]) on 11/08/2020 1:22:20 PM   Radiology No results found.  Procedures .Critical  Care Performed by: Lorelee New, PA-C Authorized by: Lorelee New, PA-C   Critical care provider statement:    Critical care time (minutes):  45   Critical care was necessary to treat or prevent imminent or life-threatening deterioration of the following conditions:  Metabolic crisis   Critical care was time spent personally by me on the following activities:  Discussions with consultants, evaluation of patient's  response to treatment, examination of patient, ordering and performing treatments and interventions, ordering and review of laboratory studies, ordering and review of radiographic studies, pulse oximetry, re-evaluation of patient's condition, obtaining history from patient or surrogate and review of old charts Comments:     DKA in new DM patient     Medications Ordered in ED Medications  lactated ringers bolus 20 mL/kg (has no administration in time range)  insulin regular, human (MYXREDLIN) 100 units/ 100 mL infusion (has no administration in time range)  lactated ringers infusion (has no administration in time range)  dextrose 5 % in lactated ringers infusion (has no administration in time range)  dextrose 50 % solution 0-50 mL (has no administration in time range)  potassium chloride 10 mEq in 100 mL IVPB (has no administration in time range)  alum & mag hydroxide-simeth (MAALOX/MYLANTA) 200-200-20 MG/5ML suspension 30 mL (30 mLs Oral Given 11/08/20 1230)    And  lidocaine (XYLOCAINE) 2 % viscous mouth solution 15 mL (15 mLs Oral Given 11/08/20 1231)    ED Course  I have reviewed the triage vital signs and the nursing notes.  Pertinent labs & imaging results that were available during my care of the patient were reviewed by me and considered in my medical decision making (see chart for details).  Clinical Course as of 11/08/20 1511  Thu Nov 08, 2020  1510 I spoke with Dr. Chipper Herb who will see and admit patient. [GG]    Clinical Course User Index [GG] Lorelee New, PA-C   MDM Rules/Calculators/A&P                          Angel Hobdy was evaluated in Emergency Department on 11/08/2020 for the symptoms described in the history of present illness. She was evaluated in the context of the global COVID-19 pandemic, which necessitated consideration that the patient might be at risk for infection with the SARS-CoV-2 virus that causes COVID-19. Institutional protocols and algorithms that pertain to the evaluation of patients at risk for COVID-19 are in a state of rapid change based on information released by regulatory bodies including the CDC and federal and state organizations. These policies and algorithms were followed during the patient's care in the ED.  I personally reviewed patient's medical chart and all notes from triage and staff during today's encounter. I have also ordered and reviewed all labs and imaging that I felt to be medically necessary in the evaluation of this patient's complaints and with consideration of their with their physical exam. If needed, translation services were available and utilized.   While patient's initial history and physical exam was suggestive of gastroesophageal reflux disease, she was also noted to be dehydrated and tachycardic here in the ED.  UA demonstrated evidence of dehydration with 80 ketones and elevated specific gravity, but there was also excessive glucose urea.  CMP notable for hyperglycemia to 851.  She also has mild AKI with creatinine elevated 1.29 and GFR reduced to 57.  She also has an elevated anion gap metabolic acidosis suggestive of DKA.  This is new onset diabetes.  Patient will need to be admitted.  The patient was counseled on the dangers of tobacco use, and was advised to quit.  Reviewed strategies to maximize success, including removing cigarettes and smoking materials from environment, stress management, substitution of other forms of reinforcement, support of family/friends and written  materials. Total time was 5 min CPT code 17616.  Final Clinical Impression(s) / ED Diagnoses Final diagnoses:  Diabetic ketoacidosis without coma associated with type 2 diabetes mellitus (HCC)    Rx / DC Orders ED Discharge Orders         Ordered    omeprazole (PRILOSEC) 40 MG capsule  Daily        11/08/20 1401           Lorelee New, PA-C 11/08/20 1442    Rozelle Logan, DO 11/08/20 1923

## 2020-11-09 ENCOUNTER — Other Ambulatory Visit (HOSPITAL_COMMUNITY): Payer: Self-pay | Admitting: Internal Medicine

## 2020-11-09 ENCOUNTER — Encounter (HOSPITAL_COMMUNITY): Payer: Self-pay | Admitting: Internal Medicine

## 2020-11-09 DIAGNOSIS — E1110 Type 2 diabetes mellitus with ketoacidosis without coma: Principal | ICD-10-CM

## 2020-11-09 LAB — BASIC METABOLIC PANEL
Anion gap: 11 (ref 5–15)
Anion gap: 9 (ref 5–15)
BUN: 6 mg/dL (ref 6–20)
BUN: 8 mg/dL (ref 6–20)
CO2: 23 mmol/L (ref 22–32)
CO2: 23 mmol/L (ref 22–32)
Calcium: 8.4 mg/dL — ABNORMAL LOW (ref 8.9–10.3)
Calcium: 8.4 mg/dL — ABNORMAL LOW (ref 8.9–10.3)
Chloride: 98 mmol/L (ref 98–111)
Chloride: 102 mmol/L (ref 98–111)
Creatinine, Ser: 0.88 mg/dL (ref 0.44–1.00)
Creatinine, Ser: 0.72 mg/dL (ref 0.44–1.00)
GFR, Estimated: >60 mL/min (ref >60)
GFR, Estimated: >60 mL/min (ref >60)
Glucose, Bld: 208 mg/dL — ABNORMAL HIGH (ref 70–99)
Glucose, Bld: 193 mg/dL — ABNORMAL HIGH (ref 70–99)
Potassium: 4 mmol/L (ref 3.5–5.1)
Potassium: 3 mmol/L — ABNORMAL LOW (ref 3.5–5.1)
Sodium: 132 mmol/L — ABNORMAL LOW (ref 135–145)
Sodium: 134 mmol/L — ABNORMAL LOW (ref 135–145)

## 2020-11-09 LAB — CBG MONITORING, ED
Glucose-Capillary: 176 mg/dL — ABNORMAL HIGH (ref 70–99)
Glucose-Capillary: 188 mg/dL — ABNORMAL HIGH (ref 70–99)
Glucose-Capillary: 196 mg/dL — ABNORMAL HIGH (ref 70–99)
Glucose-Capillary: 202 mg/dL — ABNORMAL HIGH (ref 70–99)
Glucose-Capillary: 203 mg/dL — ABNORMAL HIGH (ref 70–99)
Glucose-Capillary: 207 mg/dL — ABNORMAL HIGH (ref 70–99)
Glucose-Capillary: 211 mg/dL — ABNORMAL HIGH (ref 70–99)
Glucose-Capillary: 221 mg/dL — ABNORMAL HIGH (ref 70–99)
Glucose-Capillary: 224 mg/dL — ABNORMAL HIGH (ref 70–99)
Glucose-Capillary: 238 mg/dL — ABNORMAL HIGH (ref 70–99)
Glucose-Capillary: 324 mg/dL — ABNORMAL HIGH (ref 70–99)

## 2020-11-09 LAB — SARS CORONAVIRUS 2 BY RT PCR (HOSPITAL ORDER, PERFORMED IN ~~LOC~~ HOSPITAL LAB): SARS Coronavirus 2: NEGATIVE

## 2020-11-09 LAB — GLUCOSE, CAPILLARY: Glucose-Capillary: 349 mg/dL — ABNORMAL HIGH (ref 70–99)

## 2020-11-09 LAB — BETA-HYDROXYBUTYRIC ACID
Beta-Hydroxybutyric Acid: 0.94 mmol/L — ABNORMAL HIGH (ref 0.05–0.27)
Beta-Hydroxybutyric Acid: 0.79 mmol/L — ABNORMAL HIGH (ref 0.05–0.27)

## 2020-11-09 MED ORDER — PEN NEEDLES 33G X 4 MM MISC
1.0000 | Freq: Four times a day (QID) | 0 refills | Status: DC
Start: 2020-11-09 — End: 2022-06-26

## 2020-11-09 MED ORDER — INSULIN ASPART 100 UNIT/ML ~~LOC~~ SOLN
0.0000 [IU] | Freq: Every day | SUBCUTANEOUS | Status: DC
  Administered 2020-11-09: 4 [IU] via SUBCUTANEOUS
  Administered 2020-11-10: 2 [IU] via SUBCUTANEOUS

## 2020-11-09 MED ORDER — INSULIN ASPART 100 UNIT/ML ~~LOC~~ SOLN
0.0000 [IU] | Freq: Three times a day (TID) | SUBCUTANEOUS | Status: DC
  Administered 2020-11-09: 3 [IU] via SUBCUTANEOUS
  Administered 2020-11-09 – 2020-11-10 (×2): 11 [IU] via SUBCUTANEOUS
  Administered 2020-11-10: 15 [IU] via SUBCUTANEOUS
  Administered 2020-11-10: 11 [IU] via SUBCUTANEOUS
  Administered 2020-11-11: 5 [IU] via SUBCUTANEOUS

## 2020-11-09 MED ORDER — BLOOD GLUCOSE MONITOR KIT: PACK | 0 refills | Status: DC

## 2020-11-09 MED ORDER — INSULIN GLARGINE 100 UNIT/ML ~~LOC~~ SOLN
20.0000 [IU] | Freq: Every day | SUBCUTANEOUS | Status: DC
  Administered 2020-11-09: 20 [IU] via SUBCUTANEOUS
  Filled 2020-11-09 (×2): qty 0.2

## 2020-11-09 MED ORDER — INSULIN ASPART 100 UNIT/ML FLEXPEN: 5.0000 [IU] | PEN_INJECTOR | Freq: Three times a day (TID) | SUBCUTANEOUS | 1 refills | Status: DC

## 2020-11-09 MED ORDER — INSULIN STARTER KIT- PEN NEEDLES (ENGLISH)
1.0000 | Freq: Once | Status: AC
  Administered 2020-11-09: 1
  Filled 2020-11-09: qty 1

## 2020-11-09 MED ORDER — LIVING WELL WITH DIABETES BOOK
Freq: Once | Status: AC
  Filled 2020-11-09: qty 1

## 2020-11-09 MED ORDER — INSULIN GLARGINE 100 UNIT/ML SOLOSTAR PEN: 20.0000 [IU] | PEN_INJECTOR | Freq: Every day | SUBCUTANEOUS | 1 refills | Status: DC

## 2020-11-09 MED FILL — LANTUS SOLOSTAR 100 UNITS/M: 100 | 30 days supply | Qty: 9 | Fill #0

## 2020-11-09 MED FILL — TRUE METRIX BLOOD GLUCOSE M: W/DEVICE | 1 days supply | Qty: 1 | Fill #0

## 2020-11-09 MED FILL — NOVOLOG FLEXPEN SYRINGE: 100 | 30 days supply | Qty: 6 | Fill #0

## 2020-11-09 MED FILL — TRUE METRIX GLUCOSE TEST ST: 25 days supply | Qty: 100 | Fill #0

## 2020-11-09 MED FILL — PENTIPS 32G X 4 MM MISC: 32G X 4 MM | 20 days supply | Qty: 100 | Fill #0

## 2020-11-09 MED FILL — TRUEplus LANCETS 28G MISC: 25 days supply | Qty: 100 | Fill #0

## 2020-11-09 NOTE — ED Notes (Signed)
Dinner Trays Ordered @ 1635. 

## 2020-11-09 NOTE — Progress Notes (Signed)
PROGRESS NOTE  Helen Dorsey DGL:[id number] DOB: 1991-06-19 DOA: 11/08/2020 PCP: Ob/Gyn, [place]   LOS: 1 day   Brief Narrative / Interim history: 30 year old female with obesity but no other significant medical problems comes to the hospital with complaints of epigastric pain, feeling thirsty as well as nausea and vomiting. She was found to be in DKA. She is not a known diabetic prior. She was placed on insulin infusion  Subjective / 24h Interval events: She is feeling better this morning, no longer has nausea or vomiting.  Assessment & Plan: Principal Problem DKA in the setting of new onset diabetes mellitus -Not clear if type I or II, diabetes coordinator consulted. Her gap is closed, she is feeling better, will be transitioned to subcutaneous insulin. Does not have a PCP, will consult TOC. Lack of insurance will make insulin regimen slightly difficult, probably needs to go home on 70/30 -A1c 13  Active Problems Epigastric pain -Likely in the setting of DKA, improved  Morbid obesity -Recommend weight loss  Scheduled Meds: . insulin aspart  0-15 Units Subcutaneous TID WC  . insulin aspart  0-5 Units Subcutaneous QHS  . insulin glargine  20 Units Subcutaneous Daily  . pantoprazole  40 mg Oral Daily   Continuous Infusions: . dextrose 5% lactated ringers 125 mL/hr at 11/08/20 1814  . lactated ringers Stopped (11/08/20 1951)   PRN Meds:.dextrose, metoCLOPramide (REGLAN) injection  Diet Orders (From admission, onward)    Start     Ordered   11/09/20 0733  Diet Carb Modified Fluid consistency: Thin; Room service appropriate? Yes  Diet effective now       Question Answer Comment  Diet-HS Snack? Nothing   Calorie Level Medium 1600-2000   Fluid consistency: Thin   Room service appropriate? Yes      11/09/20 0732          DVT prophylaxis: SCDs Start: 11/08/20 1532     Code Status: Full Code  Family Communication: friend at bedside   Status is:  Inpatient  Remains inpatient appropriate because:Inpatient level of care appropriate due to severity of illness   Dispo: The patient is from: Home              Anticipated d/c is to: Home              Anticipated d/c date is: 1 day              Patient currently is not medically stable to d/c.   Difficult to place patient No  Level of care: Progressive  Consultants:  None   Procedures:  None   Microbiology  None   Antimicrobials: None     Objective: Vitals:   11/09/20 0700 11/09/20 0715 11/09/20 0730 11/09/20 0745  BP: 118/85 (!) 125/92 124/83 (!) 122/97  Pulse: (!) 103 (!) 101 (!) 104 89  Resp: (!) 22 (!) 21 16 20   Temp:      TempSrc:      SpO2: 97% 97% 99% 98%  Weight:        Intake/Output Summary (Last 24 hours) at 11/09/2020 0756 Last data filed at 11/08/2020 2210 Gross per 24 hour  Intake 3298.98 ml  Output --  Net 3298.98 ml   Filed Weights   11/08/20 1440  Weight: 104.3 kg    Examination:  Constitutional: NAD Eyes: no scleral icterus ENMT: Mucous membranes are moist.  Neck: normal, supple Respiratory: clear to auscultation bilaterally, no wheezing, no crackles. Normal respiratory effort. No accessory muscle  use.  Cardiovascular: Regular rate and rhythm, no murmurs / rubs / gallops. No LE edema.  Abdomen: non distended, no tenderness. Bowel sounds positive.  Musculoskeletal: no clubbing / cyanosis.  Skin: no rashes Neurologic: CN 2-12 grossly intact. Strength 5/5 in all 4.  Psychiatric: Normal judgment and insight. Alert and oriented x 3. Normal mood.    Data Reviewed: I have independently reviewed following labs and imaging studies   CBC: Recent Labs  Lab 11/08/20 1237 11/08/20 1851  WBC 7.6  --   NEUTROABS 5.4  --   HGB 14.6 15.3*  HCT 42.7 45.0  MCV 77.5*  --   PLT 250  --    Basic Metabolic Panel: Recent Labs  Lab 11/08/20 1237 11/08/20 1851 11/08/20 2029 11/09/20 0410  NA 123* 124* 133* 132*  K 4.3 4.7 3.4* 4.0  CL 85*   --  96* 98  CO2 18*  --  20* 23  GLUCOSE 851*  --  224* 208*  BUN 9  --  7 6  CREATININE 1.29*  --  0.85 0.88  CALCIUM 9.4  --  9.0 8.4*   Liver Function Tests: Recent Labs  Lab 11/08/20 1237  AST 30  ALT 57*  ALKPHOS 154*  BILITOT 1.5*  PROT 8.4*  ALBUMIN 4.2   Coagulation Profile: No results for input(s): INR, PROTIME in the last 168 hours. HbA1C: Recent Labs    11/08/20 1237  HGBA1C 13.0*   CBG: Recent Labs  Lab 11/09/20 0149 11/09/20 0255 11/09/20 0401 11/09/20 0500 11/09/20 0616  GLUCAP 224* 211* 203* 221* 207*    Recent Results (from the past 240 hour(s))  SARS Coronavirus 2 by RT PCR (hospital order, performed in [facility] hospital lab) Nasopharyngeal Nasopharyngeal Swab     Status: None   Collection Time: 11/09/20 12:45 AM   Specimen: Nasopharyngeal Swab  Result Value Ref Range Status   SARS Coronavirus 2 NEGATIVE NEGATIVE Final    Comment: (NOTE) SARS-CoV-2 target nucleic acids are NOT DETECTED.  The SARS-CoV-2 RNA is generally detectable in upper and lower respiratory specimens during the acute phase of infection. The lowest concentration of SARS-CoV-2 viral copies this assay can detect is 250 copies / mL. A negative result does not preclude SARS-CoV-2 infection and should not be used as the sole basis for treatment or other patient management decisions.  A negative result may occur with improper specimen collection / handling, submission of specimen other than nasopharyngeal swab, presence of viral mutation(s) within the areas targeted by this assay, and inadequate number of viral copies (<250 copies / mL). A negative result must be combined with clinical observations, patient history, and epidemiological information.  Fact Sheet for Patients:   BoilerBrush.com.cy  Fact Sheet for Healthcare Providers: https://pope.com/  This test is not yet approved or  cleared by the [place] FDA and has  been authorized for detection and/or diagnosis of SARS-CoV-2 by FDA under an Emergency Use Authorization (EUA).  This EUA will remain in effect (meaning this test can be used) for the duration of the COVID-19 declaration under Section 564(b)(1) of the Act, 21 U.S.C. section 360bbb-3(b)(1), unless the authorization is terminated or revoked sooner.  Performed at [facility], [address] [place], [place] [address]      Radiology Studies: No results found.  Pamella Pert, MD, PhD [facility]  Between 7 am - 7 pm I am available, please contact me via Amion or Securechat  Between 7 pm - 7 am I  am not available, please contact night coverage MD/APP via Amion

## 2020-11-09 NOTE — Care Management (Signed)
Patient home medications are located  in the inpatient pharmacy, receipt was given to ED RN to place in shadow chart.

## 2020-11-09 NOTE — Discharge Planning (Signed)
RNCM consulted regarding uninsured pt requiring diabetic Rx and supplies.  RNCM enrolled pt in Medication Assistance Through [facility]) program. Transitions of Care Pharmacy will deliver Rx to patient at bedside prior to discharge from hospital.

## 2020-11-09 NOTE — ED Notes (Signed)
Lantus dose missing from pharmacy. Pharmacy contacted by RN, pharm states they will send Lantus to ED now.

## 2020-11-09 NOTE — ED Notes (Signed)
Attempted to call report, unsuccessful, will call back later.

## 2020-11-09 NOTE — Progress Notes (Signed)
   11/09/20 1244  TOC ED Mini Assessment  TOC Time spent with patient (minutes): 45  PING Used in TOC Assessment Yes  Interventions which prevented an admission or readmission Medication Review;Follow-up medical appointment  What brought you to the Emergency Department?  n,v, heartburn  Barriers to Discharge Continued Medical Work up  [employer] of departure Car    Sherah Lund J. Lucretia Roers, RN, BSN, [place] [phone]  RNCM set up appointment with Gwinda Passe, NP on 2/10 @0930 .  Spoke with pt at bedside and advised to please arrive 15 min early and take a picture ID and your current medications.  Pt verbalizes understanding of keeping appointment.

## 2020-11-09 NOTE — Progress Notes (Signed)
Inpatient Diabetes Program Recommendations  AACE/ADA: New Consensus Statement on Inpatient Glycemic Control (2015)  Target Ranges:  Prepandial:   less than 140 mg/dL      Peak postprandial:   less than 180 mg/dL (1-2 hours)      Critically ill patients:  140 - 180 mg/dL   Lab Results  Component Value Date   GLUCAP 176 (H) 11/09/2020   HGBA1C 13.0 (H) 11/08/2020    Review of Glycemic Control  Diabetes history: New Diagnosis this admission  Current orders for Inpatient glycemic control:  Lantus 20 units Novolog 0-15 units tid + hs  Spoke with patient about new diabetes diagnosis.  Discussed A1C results (13%) and explained what an A1C is. Discussed basic pathophysiology of DM Type 2, basic home care, importance of checking CBGs and maintaining good CBG control to prevent long-term and short-term complications. Reviewed glucose and A1C goals.  Reviewed signs and symptoms of hyperglycemia and hypoglycemia along with treatment for both. Discussed impact of nutrition, exercise, stress, sickness, and medications on diabetes control.   Reviewed Living Well with diabetes booklet and encouraged patient to read through entire book. Educated patient on insulin pen use at home. Reviewed contents of insulin flexpen starter kit. Reviewed all steps if insulin pen including attachment of needle, 2-unit air shot, dialing up dose, giving injection, removing needle, disposal of sharps, storage of unused insulin, disposal of insulin etc. Patient able to provide successful return demonstration. Also reviewed troubleshooting with insulin pen.   Asked patient to check her glucose 4 times per day (before meals and at bedtime) and to keep a log book of glucose readings and insulin taken. Explained how the doctor he follows up with can use the log book to continue to make insulin adjustments if needed.   Patient verbalized understanding of information discussed and has no further questions at this time related to  diabetes.   RNs to provide ongoing basic DM education at bedside with this patient and engage patient to actively check blood glucose and administer insulin injections.   Thanks, Tama Headings RN, MSN, BC-ADM Inpatient Diabetes Coordinator Team Pager [phone] (8a-5p)

## 2020-11-09 NOTE — Discharge Planning (Signed)
  [facility] Medication Assistance Card Name: Kerly Rigsbee ID (MRN): [id number] Bin: [id number] RX Group: [id number] Discharge Date: 11/09/2020 Expiration Date:11/17/2020                                           (must be filled within 7 days of discharge)     You have been approved to have the prescriptions written by your discharging physician filled through our [facility] (Medication Assistance Through [facility]) program. This program allows for a one-time (no refills) 34-day supply of selected medications for a low copay amount.  The copay is $3.00 per prescription. For instance, if you have one prescription, you will pay $3.00; for two prescriptions, you pay $6.00; for three prescriptions, you pay $9.00; and so on.  Only certain pharmacies are participating in this program with [facility] You will need to select one of the pharmacies from the attached list and take your prescriptions, this letter, and your photo ID to one of the [facility] pharmacies, [facility], [facility] [address] or [facility] [address].   We are excited that you are able to use the [facility] program to get your medications. These prescriptions must be filled within 7 days of hospital discharge or they will no longer be valid for the [facility] program. Should you have any problems with your prescriptions please contact your case management team member at [phone] for [place]/ [facility].  Thank you, [facility] Care Management

## 2020-11-10 ENCOUNTER — Encounter (HOSPITAL_COMMUNITY): Payer: Self-pay | Admitting: Internal Medicine

## 2020-11-10 LAB — GLUCOSE, CAPILLARY
Glucose-Capillary: 324 mg/dL — ABNORMAL HIGH (ref 70–99)
Glucose-Capillary: 330 mg/dL — ABNORMAL HIGH (ref 70–99)
Glucose-Capillary: 362 mg/dL — ABNORMAL HIGH (ref 70–99)
Glucose-Capillary: 233 mg/dL — ABNORMAL HIGH (ref 70–99)

## 2020-11-10 LAB — BASIC METABOLIC PANEL
Anion gap: 14 (ref 5–15)
BUN: 7 mg/dL (ref 6–20)
CO2: 19 mmol/L — ABNORMAL LOW (ref 22–32)
Calcium: 8.6 mg/dL — ABNORMAL LOW (ref 8.9–10.3)
Chloride: 102 mmol/L (ref 98–111)
Creatinine, Ser: 0.97 mg/dL (ref 0.44–1.00)
GFR, Estimated: >60 mL/min (ref >60)
Glucose, Bld: 314 mg/dL — ABNORMAL HIGH (ref 70–99)
Potassium: 3.1 mmol/L — ABNORMAL LOW (ref 3.5–5.1)
Sodium: 135 mmol/L (ref 135–145)

## 2020-11-10 LAB — CBC
HCT: 34.1 % — ABNORMAL LOW (ref 36.0–46.0)
Hemoglobin: 12.1 g/dL (ref 12.0–15.0)
MCH: 27 pg (ref 26.0–34.0)
MCHC: 35.5 g/dL (ref 30.0–36.0)
MCV: 76.1 fL — ABNORMAL LOW (ref 80.0–100.0)
Platelets: 203 10*3/uL (ref 150–400)
RBC: 4.48 MIL/uL (ref 3.87–5.11)
RDW: 15.2 % (ref 11.5–15.5)
WBC: 8.3 10*3/uL (ref 4.0–10.5)
nRBC: 0 % (ref 0.0–0.2)

## 2020-11-10 LAB — C-PEPTIDE: C-Peptide: 1.4 ng/mL (ref 1.1–4.4)

## 2020-11-10 MED ORDER — INSULIN ASPART 100 UNIT/ML ~~LOC~~ SOLN
3.0000 [IU] | Freq: Three times a day (TID) | SUBCUTANEOUS | Status: DC
  Administered 2020-11-10 – 2020-11-11 (×3): 3 [IU] via SUBCUTANEOUS

## 2020-11-10 MED ORDER — FLUCONAZOLE 100 MG PO TABS
100.0000 mg | ORAL_TABLET | Freq: Once | ORAL | Status: AC
  Administered 2020-11-10: 100 mg via ORAL
  Filled 2020-11-10 (×2): qty 1

## 2020-11-10 MED ORDER — POTASSIUM CHLORIDE CRYS ER 20 MEQ PO TBCR
40.0000 meq | EXTENDED_RELEASE_TABLET | Freq: Once | ORAL | Status: AC
  Administered 2020-11-10: 40 meq via ORAL
  Filled 2020-11-10: qty 2

## 2020-11-10 MED ORDER — INSULIN GLARGINE 100 UNIT/ML ~~LOC~~ SOLN
25.0000 [IU] | Freq: Every day | SUBCUTANEOUS | Status: DC
  Administered 2020-11-10 – 2020-11-11 (×2): 25 [IU] via SUBCUTANEOUS
  Filled 2020-11-10 (×3): qty 0.25

## 2020-11-10 NOTE — Progress Notes (Signed)
PROGRESS NOTE  Helen Dorsey INO:[id number] DOB: Sep 01, 1991 DOA: 11/08/2020 [facility]    LOS: 2 days   Brief Narrative / Interim history: 30 year old female with obesity but no other significant medical problems comes to the hospital with complaints of epigastric pain, feeling thirsty as well as nausea and vomiting. She was found to be in DKA. She is not a known diabetic prior. She was placed on insulin infusion  Subjective / 24h Interval events: Seems anxious about her diabetes. No chest pain, no nausea/vomiting. Has not checked her own CBGs nor given her won insulin yet  Assessment & Plan: Principal Problem DKA in the setting of new onset diabetes mellitus -Not clear if type I or II, diabetes coordinator consulted. She initially required insulin infusion, now transitioned to s.q insulin. CBGs poorly controlled today, increase her insulin regimen.  -teaching today CBG self check and insulin injections. She seems a bit apprehensive about this.   Active Problems Epigastric pain -Likely in the setting of DKA, improved  Morbid obesity -Recommend weight loss  Scheduled Meds: . insulin aspart  0-15 Units Subcutaneous TID WC  . insulin aspart  0-5 Units Subcutaneous QHS  . insulin aspart  3 Units Subcutaneous TID WC  . insulin glargine  25 Units Subcutaneous Daily  . pantoprazole  40 mg Oral Daily   Continuous Infusions: . lactated ringers Stopped (11/08/20 1951)   PRN Meds:.dextrose, metoCLOPramide (REGLAN) injection  Diet Orders (From admission, onward)    Start     Ordered   11/09/20 0733  Diet Carb Modified Fluid consistency: Thin; Room service appropriate? Yes  Diet effective now       Question Answer Comment  Diet-HS Snack? Nothing   Calorie Level Medium 1600-2000   Fluid consistency: Thin   Room service appropriate? Yes      11/09/20 0732          DVT prophylaxis: SCDs Start: 11/08/20 1532     Code Status: Full Code  Family Communication:  friend at bedside   Status is: Inpatient  Remains inpatient appropriate because:Inpatient level of care appropriate due to severity of illness   Dispo: The patient is from: Home              Anticipated d/c is to: Home              Anticipated d/c date is: 1 day              Patient currently is not medically stable to d/c.   Difficult to place patient No  Level of care: Med-Surg  Consultants:  None   Procedures:  None   Microbiology  None   Antimicrobials: None     Objective: Vitals:   11/09/20 2346 11/10/20 0623 11/10/20 0732 11/10/20 0800  BP: (!) 135/99 106/75 126/62   Pulse: 93 97 81   Resp: 18 18 16    Temp: 98.1 F (36.7 C) 98.1 F (36.7 C) 98 F (36.7 C)   TempSrc: Oral Oral Oral   SpO2: 100% 98% 100%   Weight:    104.3 kg  Height:    5' 2.5" (1.588 m)    Intake/Output Summary (Last 24 hours) at 11/10/2020 1310 Last data filed at 11/10/2020 0400 Gross per 24 hour  Intake 240 ml  Output --  Net 240 ml   Filed Weights   11/08/20 1440 11/10/20 0800  Weight: 104.3 kg 104.3 kg    Examination:  Constitutional: NAD Eyes: no icterus  ENMT: mmm Neck:  normal, supple Respiratory: CTA biL, no wheezing  Cardiovascular: RRR, no MRG Abdomen: non distended, BS+ Musculoskeletal: no clubbing / cyanosis.  Skin: no rashes Neurologic: non focal    Data Reviewed: I have independently reviewed following labs and imaging studies   CBC: Recent Labs  Lab 11/08/20 1237 11/08/20 1851 11/10/20 0712  WBC 7.6  --  8.3  NEUTROABS 5.4  --   --   HGB 14.6 15.3* 12.1  HCT 42.7 45.0 34.1*  MCV 77.5*  --  76.1*  PLT 250  --  203   Basic Metabolic Panel: Recent Labs  Lab 11/08/20 1237 11/08/20 1851 11/08/20 2029 11/09/20 0410 11/09/20 0923 11/10/20 0712  NA 123* 124* 133* 132* 134* 135  K 4.3 4.7 3.4* 4.0 3.0* 3.1*  CL 85*  --  96* 98 102 102  CO2 18*  --  20* 23 23 19*  GLUCOSE 851*  --  224* 208* 193* 314*  BUN 9  --  7 6 8 7   CREATININE 1.29*  --   0.85 0.88 0.72 0.97  CALCIUM 9.4  --  9.0 8.4* 8.4* 8.6*   Liver Function Tests: Recent Labs  Lab 11/08/20 1237  AST 30  ALT 57*  ALKPHOS 154*  BILITOT 1.5*  PROT 8.4*  ALBUMIN 4.2   Coagulation Profile: No results for input(s): INR, PROTIME in the last 168 hours. HbA1C: Recent Labs    11/08/20 1237  HGBA1C 13.0*   CBG: Recent Labs  Lab 11/09/20 1136 11/09/20 1706 11/09/20 2109 11/10/20 0622 11/10/20 1145  GLUCAP 176* 324* 349* 324* 330*    Recent Results (from the past 240 hour(s))  SARS Coronavirus 2 by RT PCR (hospital order, performed in [facility] hospital lab) Nasopharyngeal Nasopharyngeal Swab     Status: None   Collection Time: 11/09/20 12:45 AM   Specimen: Nasopharyngeal Swab  Result Value Ref Range Status   SARS Coronavirus 2 NEGATIVE NEGATIVE Final    Comment: (NOTE) SARS-CoV-2 target nucleic acids are NOT DETECTED.  The SARS-CoV-2 RNA is generally detectable in upper and lower respiratory specimens during the acute phase of infection. The lowest concentration of SARS-CoV-2 viral copies this assay can detect is 250 copies / mL. A negative result does not preclude SARS-CoV-2 infection and should not be used as the sole basis for treatment or other patient management decisions.  A negative result may occur with improper specimen collection / handling, submission of specimen other than nasopharyngeal swab, presence of viral mutation(s) within the areas targeted by this assay, and inadequate number of viral copies (<250 copies / mL). A negative result must be combined with clinical observations, patient history, and epidemiological information.  Fact Sheet for Patients:   11/11/20  Fact Sheet for Healthcare Providers: BoilerBrush.com.cy  This test is not yet approved or  cleared by the https://pope.com/ FDA and has been authorized for detection and/or diagnosis of SARS-CoV-2 by FDA under an  Emergency Use Authorization (EUA).  This EUA will remain in effect (meaning this test can be used) for the duration of the COVID-19 declaration under Section 564(b)(1) of the Act, 21 U.S.C. section 360bbb-3(b)(1), unless the authorization is terminated or revoked sooner.  Performed at [facility], [address] [place], [place]      Radiology Studies: No results found.  [id number], MD, PhD [facility]  Between 7 am - 7 pm I am available, please contact me via Amion or Securechat  Between 7 pm - 7 am I  am not available, please contact night coverage MD/APP via Amion

## 2020-11-11 LAB — GLUCOSE, CAPILLARY: Glucose-Capillary: 215 mg/dL — ABNORMAL HIGH (ref 70–99)

## 2020-11-11 MED ORDER — FLUCONAZOLE 100 MG PO TABS: 100.0000 mg | ORAL_TABLET | Freq: Every day | ORAL | 1 refills | Status: DC

## 2020-11-11 MED ORDER — INSULIN GLARGINE 100 UNIT/ML SOLOSTAR PEN: 25.0000 [IU] | PEN_INJECTOR | Freq: Every day | SUBCUTANEOUS | 1 refills | Status: DC

## 2020-11-11 MED ORDER — METRONIDAZOLE 500 MG PO TABS: 500.0000 mg | ORAL_TABLET | Freq: Three times a day (TID) | ORAL | 0 refills | Status: AC

## 2020-11-11 NOTE — Discharge Summary (Addendum)
 Physician Discharge Summary  Helen Dorsey MRN:[id number] DOB: 12/11/1990 DOA: 11/08/2020  PCP: [facility] [place]  Admit date: 11/08/2020 Discharge date: 11/11/2020  Admitted From: home Disposition:  home  Recommendations for Outpatient Follow-up:  1. Follow up with PCP in 1-2 weeks.  Has an appointment on 2/10  Home Health: none Equipment/Devices: none  Discharge Condition: stable CODE STATUS: Full code Diet recommendation: diabetic   HPI: Per admitting MD, Helen Dorsey is a 29 y.o. female with no significant past medical history presented with epigastric pain for 1 week. Patient has been experiencing burning/aching like epigastric pain every time after eating or drinking regular food. Symptoms usually last 1 to 2 hours and sometimes also had the same pain at night. Occasionally accompanied with feeling nauseous no vomiting of food she just ate. She has been using TUMS with minimum help. She also noticed she has been feeling more thirsty lately had not had to drink more than usual amount of liquid daytime still feels thirsty. She also endorsed feeling tired whole week last week.  Hospital Course / Discharge diagnoses: Principal Problem DKA in the setting of new onset diabetes mellitus -patient was initially placed on insulin infusion, her gap is closed and bicarb normalized.  She was transitioned to subcutaneous insulin.  Initially her CBGs were somewhat difficult to control however they are much improved on a regimen which consists of 25 units of Lantus and NovoLog.  Insulin was prescribed and filled at the TOC pharmacy prior to discharge.  Her A1c was found to be 13.  She received extensive teaching from diabetes coordinator, she is able to check her CBGs and administer her own insulins.  She will be discharged home in stable condition.  She will need outpatient follow-up, has an initial appointment established for 2/10.  Active Problems Epigastric pain -Likely in the  setting of DKA, resolved Hypokalemia-repleted prior to discharge Morbid obesity -Recommend weight loss BV-Short course of metronidazole, possibly fungal will give Diflucan also, recommend outpatient follow-up  Sepsis ruled out   Discharge Instructions   Allergies as of 11/11/2020   No Known Allergies     Medication List    TAKE these medications   blood glucose meter kit and supplies Kit Dispense based on patient and insurance preference. Use up to four times daily as directed. (FOR ICD-9 250.00, 250.01).   fluconazole 100 MG tablet Commonly known as: Diflucan Take 1 tablet (100 mg total) by mouth daily.   insulin aspart 100 UNIT/ML FlexPen Commonly known as: NOVOLOG Inject 5 Units into the skin 3 (three) times daily with meals.   insulin glargine 100 UNIT/ML Solostar Pen Commonly known as: LANTUS Inject 25 Units into the skin daily.   metroNIDAZOLE 500 MG tablet Commonly known as: Flagyl Take 1 tablet (500 mg total) by mouth 3 (three) times daily for 5 days.   omeprazole 40 MG capsule Commonly known as: PRILOSEC Take 1 capsule (40 mg total) by mouth daily.   Pen Needles 33G X 4 MM Misc 1 each by Does not apply route in the morning, at noon, in the evening, and at bedtime.       Follow-up Information    [facility] Follow up on 11/22/2020.   Why: @930 Contact information: [address] [place] [address] [phone]              Consultations:  None   Procedures/Studies:   No results found.   Subjective: - no chest pain,   shortness of breath, no abdominal pain, nausea or vomiting.   Discharge Exam: BP 137/89 (BP Location: Right Arm)   Pulse 84   Temp 97.8 F (36.6 C) (Oral)   Resp 18   Ht 5' 2.5" (1.588 m)   Wt 104.3 kg   SpO2 100%   BMI 41.40 kg/m   General: Pt is alert, awake, not in acute distress Cardiovascular: RRR, S1/S2 +, no rubs, no gallops Respiratory: CTA  bilaterally, no wheezing, no rhonchi Abdominal: Soft, NT, ND, bowel sounds + Extremities: no edema, no cyanosis    The results of significant diagnostics from this hospitalization (including imaging, microbiology, ancillary and laboratory) are listed below for reference.     Microbiology: Recent Results (from the past 240 hour(s))  SARS Coronavirus 2 by RT PCR (hospital order, performed in [place] hospital lab) Nasopharyngeal Nasopharyngeal Swab     Status: None   Collection Time: 11/09/20 12:45 AM   Specimen: Nasopharyngeal Swab  Result Value Ref Range Status   SARS Coronavirus 2 NEGATIVE NEGATIVE Final    Comment: (NOTE) SARS-CoV-2 target nucleic acids are NOT DETECTED.  The SARS-CoV-2 RNA is generally detectable in upper and lower respiratory specimens during the acute phase of infection. The lowest concentration of SARS-CoV-2 viral copies this assay can detect is 250 copies / mL. A negative result does not preclude SARS-CoV-2 infection and should not be used as the sole basis for treatment or other patient management decisions.  A negative result may occur with improper specimen collection / handling, submission of specimen other than nasopharyngeal swab, presence of viral mutation(s) within the areas targeted by this assay, and inadequate number of viral copies (<250 copies / mL). A negative result must be combined with clinical observations, patient history, and epidemiological information.  Fact Sheet for Patients:   https://www.fda.gov/media/136312/download  Fact Sheet for Healthcare Providers: https://www.fda.gov/media/136313/download  This test is not yet approved or  cleared by the [place] FDA and has been authorized for detection and/or diagnosis of SARS-CoV-2 by FDA under an Emergency Use Authorization (EUA).  This EUA will remain in effect (meaning this test can be used) for the duration of the COVID-19 declaration under Section 564(b)(1) of the Act,  21 U.S.C. section 360bbb-3(b)(1), unless the authorization is terminated or revoked sooner.  Performed at [facility], [address] [place], [place] [address]      Labs: Basic Metabolic Panel: Recent Labs  Lab 11/08/20 1237 11/08/20 1851 11/08/20 2029 11/09/20 0410 11/09/20 0923 11/10/20 0712  NA 123* 124* 133* 132* 134* 135  K 4.3 4.7 3.4* 4.0 3.0* 3.1*  CL 85*  --  96* 98 102 102  CO2 18*  --  20* 23 23 19*  GLUCOSE 851*  --  224* 208* 193* 314*  BUN 9  --  7 6 8 7  CREATININE 1.29*  --  0.85 0.88 0.72 0.97  CALCIUM 9.4  --  9.0 8.4* 8.4* 8.6*   Liver Function Tests: Recent Labs  Lab 11/08/20 1237  AST 30  ALT 57*  ALKPHOS 154*  BILITOT 1.5*  PROT 8.4*  ALBUMIN 4.2   CBC: Recent Labs  Lab 11/08/20 1237 11/08/20 1851 11/10/20 0712  WBC 7.6  --  8.3  NEUTROABS 5.4  --   --   HGB 14.6 15.3* 12.1  HCT 42.7 45.0 34.1*  MCV 77.5*  --  76.1*  PLT 250  --  203   CBG: Recent Labs  Lab 11/10/20 0622 11/10/20 1145 11/10/20   1617 11/10/20 2126 11/11/20 0638  GLUCAP 324* 330* 362* 233* 215*   Hgb A1c Recent Labs    11/08/20 1237  HGBA1C 13.0*   Lipid Profile Recent Labs    11/08/20 2029  CHOL 142  HDL 37*  LDLCALC 79  TRIG 129  CHOLHDL 3.8   Thyroid function studies No results for input(s): TSH, T4TOTAL, T3FREE, THYROIDAB in the last 72 hours.  Invalid input(s): FREET3 Urinalysis    Component Value Date/Time   COLORURINE STRAW (A) 11/08/2020 1316   APPEARANCEUR HAZY (A) 11/08/2020 1316   LABSPEC 1.030 11/08/2020 1316   PHURINE 5.0 11/08/2020 1316   GLUCOSEU >=500 (A) 11/08/2020 1316   HGBUR MODERATE (A) 11/08/2020 1316   BILIRUBINUR NEGATIVE 11/08/2020 1316   KETONESUR 80 (A) 11/08/2020 1316   PROTEINUR NEGATIVE 11/08/2020 1316   UROBILINOGEN 0.2 01/30/2014 0052   NITRITE NEGATIVE 11/08/2020 1316   LEUKOCYTESUR SMALL (A) 11/08/2020 1316    FURTHER DISCHARGE INSTRUCTIONS:   Get Medicines reviewed and adjusted: Please  take all your medications with you for your next visit with your Primary MD   Laboratory/radiological data: Please request your Primary MD to go over all hospital tests and procedure/radiological results at the follow up, please ask your Primary MD to get all Hospital records sent to his/her office.   In some cases, they will be blood work, cultures and biopsy results pending at the time of your discharge. Please request that your primary care M.D. goes through all the records of your hospital data and follows up on these results.   Also Note the following: If you experience worsening of your admission symptoms, develop shortness of breath, life threatening emergency, suicidal or homicidal thoughts you must seek medical attention immediately by calling 911 or calling your MD immediately  if symptoms less severe.   You must read complete instructions/literature along with all the possible adverse reactions/side effects for all the Medicines you take and that have been prescribed to you. Take any new Medicines after you have completely understood and accpet all the possible adverse reactions/side effects.    Do not drive when taking Pain medications or sleeping medications (Benzodaizepines)   Do not take more than prescribed Pain, Sleep and Anxiety Medications. It is not advisable to combine anxiety,sleep and pain medications without talking with your primary care practitioner   Special Instructions: If you have smoked or chewed Tobacco  in the last 2 yrs please stop smoking, stop any regular Alcohol  and or any Recreational drug use.   Wear Seat belts while driving.   Please note: You were cared for by a hospitalist during your hospital stay. Once you are discharged, your primary care physician will handle any further medical issues. Please note that NO REFILLS for any discharge medications will be authorized once you are discharged, as it is imperative that you return to your primary care  physician (or establish a relationship with a primary care physician if you do not have one) for your post hospital discharge needs so that they can reassess your need for medications and monitor your lab values.  Time coordinating discharge: 35 minutes  SIGNED:  Marzetta Board, MD, PhD 11/11/2020, 8:45 AM

## 2020-11-11 NOTE — Discharge Instructions (Signed)
Ask about a c-peptide level when at your PCP  Take 25 units of Lantus daily  Take 5 units of Novolog with each meal.   If you check your CBG prior to eating take 5 units of Novolog and, in addition, add more units as per table below. For example, if your sugar is 280 and you are about to eat, take 5 units PLUS 5 as per table below, so total of 10 units of Novolog.  CBG 70 - 120: 0 units   CBG 121 - 150: 1 units   CBG 151 - 200: 2 units   CBG 201 - 250: 3 units   CBG 251 - 300: 5 units   CBG 301 - 350: 8 units   CBG 351 - 400: 10 units

## 2020-11-11 NOTE — Progress Notes (Signed)
Patient is discharged from room 3C03 at this time. Alert and in stable condition. IV site d/c'd. Instructions read to patient and spouse with understanding verbalized and all questions answered. Medication given from Pharmacy. Ambulate out of unit with all belongings at side.

## 2020-11-11 NOTE — TOC Transition Note (Signed)
Transition of Care [facility]) - CM/SW Discharge Note   Patient Details  Name: Helen Dorsey MRN: [id number] Date of Birth: 07/22/1991  [facility]) CM/SW Contact:  Janae Bridgeman, RN Phone Number: 11/11/2020, 11:39 AM   Clinical Narrative:    Case management called and spoke with the patient on the phone regarding transitions of care to home today.  The patient lives her husband in the home and plans to discharge today.  The patient was set up with discharge medications through the [facility] pharmacy and I messaged the primary RN, Bubba Camp, and one of the staff members on 3C unit will sign the medications out of the pharmacy and deliver them to the patient before she is discharged home.  The patient will discharge home with family and verbalizes understanding that she will follow up with the listed discharge PCP clinic if she has questions or issues relating to her diabetes after discharge to home.   Final next level of care: Home/Self Care Barriers to Discharge: No Barriers Identified   Patient Goals and CMS Choice Patient states their goals for this hospitalization and ongoing recovery are:: Patient plans to discharge home with family today. CMS Medicare.gov Compare Post Acute Care list provided to:: Patient Choice offered to / list presented to : Patient  Discharge Placement                       Discharge Plan and Services In-house Referral: PCP / [facility] (Patient set up with outpatient clinic follow up.) Discharge Planning Services: CM Consult,[facility]igent [facility] Program,Medication Assistance,Follow-up appt scheduled (Sent message to primary RN to receive TOC medications from main pharmacy and deliver them to the patient.)                                 Social Determinants of Health (SDOH) Interventions     Readmission Risk Interventions Readmission Risk Prevention Plan 11/11/2020  Post Dischage Appt Complete  Medication  Screening Complete  Transportation Screening Complete  Some recent data might be hidden

## 2020-11-16 DIAGNOSIS — N915 Oligomenorrhea, unspecified: Secondary | ICD-10-CM | POA: Diagnosis not present

## 2020-11-16 DIAGNOSIS — E139 Other specified diabetes mellitus without complications: Secondary | ICD-10-CM | POA: Diagnosis not present

## 2020-11-22 ENCOUNTER — Other Ambulatory Visit: Payer: Self-pay

## 2020-11-22 ENCOUNTER — Ambulatory Visit (INDEPENDENT_AMBULATORY_CARE_PROVIDER_SITE_OTHER): Payer: BC Managed Care – PPO | Admitting: Primary Care

## 2020-11-22 ENCOUNTER — Encounter (INDEPENDENT_AMBULATORY_CARE_PROVIDER_SITE_OTHER): Payer: Self-pay | Admitting: Primary Care

## 2020-11-22 VITALS — BP 124/89 | HR 92 | Temp 97.3°F | Ht 62.0 in | Wt 238.8 lb

## 2020-11-22 DIAGNOSIS — K219 Gastro-esophageal reflux disease without esophagitis: Secondary | ICD-10-CM | POA: Diagnosis not present

## 2020-11-22 DIAGNOSIS — E119 Type 2 diabetes mellitus without complications: Secondary | ICD-10-CM

## 2020-11-22 DIAGNOSIS — Z7689 Persons encountering health services in other specified circumstances: Secondary | ICD-10-CM

## 2020-11-22 DIAGNOSIS — I10 Essential (primary) hypertension: Secondary | ICD-10-CM | POA: Diagnosis not present

## 2020-11-22 LAB — GLUCOSE, POCT (MANUAL RESULT ENTRY): POC Glucose: 230 mg/dl — AB (ref 70–99)

## 2020-11-22 MED ORDER — METFORMIN HCL 1000 MG PO TABS: 1000.0000 mg | ORAL_TABLET | Freq: Two times a day (BID) | ORAL | 3 refills | Status: DC

## 2020-11-22 MED ORDER — INSULIN GLARGINE 100 UNIT/ML SOLOSTAR PEN: 25.0000 [IU] | PEN_INJECTOR | Freq: Every day | SUBCUTANEOUS | 1 refills | Status: DC

## 2020-11-22 MED ORDER — INSULIN ASPART 100 UNIT/ML FLEXPEN: PEN_INJECTOR | SUBCUTANEOUS | 1 refills | Status: DC

## 2020-11-22 MED ORDER — OMEPRAZOLE 40 MG PO CPDR: 40.0000 mg | DELAYED_RELEASE_CAPSULE | Freq: Every day | ORAL | 2 refills | Status: DC

## 2020-11-22 MED ORDER — ATORVASTATIN CALCIUM 10 MG PO TABS: 10.0000 mg | ORAL_TABLET | Freq: Every day | ORAL | 3 refills | Status: DC

## 2020-11-22 NOTE — Progress Notes (Signed)
New Patient Office Visit  Subjective:  Patient ID: Helen Dorsey, female    DOB: December 14, 1990  Age: 30 y.o. MRN: [id number]  CC:  Chief Complaint  Patient presents with  . Hospitalization Follow-up    DKA  . Medication Refill    HPI Ms. Helen Dorsey is a 30 y.o.female presents for follow up from the hospital. Admit date to the hospital was 11/08/20, patient was discharged from the hospital on 11/11/20, patient was admitted for: DKA (diabetic ketoacidosis) ([place]) .DIABETES Hypoglycemic episodes:no, Polydipsia/polyuria: no, Visual disturbance: yes ( 11/19/20 first pair of eye glasses), Chest pain: no, Paresthesias: no  No past medical history on file.  No past surgical history on file.  No family history on file.  Social History   Socioeconomic History  . Marital status: Soil scientist    Spouse name: Not on file  . Number of children: Not on file  . Years of education: Not on file  . Highest education level: Not on file  Occupational History  . Not on file  Tobacco Use  . Smoking status: Current Every Day Smoker  . Smokeless tobacco: Never Used  Vaping Use  . Vaping Use: Never used  Substance and Sexual Activity  . Alcohol use: Yes    Comment: occasionally  . Drug use: No  . Sexual activity: Yes    Birth control/protection: None  Other Topics Concern  . Not on file  Social History Narrative  . Not on file   Social Determinants of Health   Financial Resource Strain: Not on file  Food Insecurity: Not on file  Transportation Needs: Not on file  Physical Activity: Not on file  Stress: Not on file  Social Connections: Not on file  Intimate Partner Violence: Not on file    ROS Review of Systems  Eyes: Positive for visual disturbance.  All other systems reviewed and are negative.   Objective:   Today's Vitals: BP 124/89 (BP Location: Right Arm, Patient Position: Sitting, Cuff Size: Large)   Pulse 92   Temp (!) 97.3 F (36.3 C)  (Temporal)   Ht _0  (1.575 m)   Wt 238 lb 12.8 oz (108.3 kg)   SpO2 94%   BMI 43.68 kg/m   Physical Exam Vitals reviewed.  Constitutional:      Appearance: Normal appearance. She is obese.  HENT:     Head: Normocephalic.     Right Ear: Tympanic membrane and external ear normal.     Left Ear: Tympanic membrane and external ear normal.     Nose: Nose normal.  Eyes:     Extraocular Movements: Extraocular movements intact.     Pupils: Pupils are equal, round, and reactive to light.  Cardiovascular:     Rate and Rhythm: Normal rate and regular rhythm.  Pulmonary:     Effort: Pulmonary effort is normal.     Breath sounds: Normal breath sounds.  Abdominal:     General: Bowel sounds are normal. There is distension.     Palpations: Abdomen is soft.  Musculoskeletal:        General: Normal range of motion.     Cervical back: Normal range of motion and neck supple.  Skin:    General: Skin is warm and dry.  Neurological:     Mental Status: She is oriented to person, place, and time.  Psychiatric:        Mood and Affect: Mood normal.        Behavior: Behavior  normal.        Thought Content: Thought content normal.        Judgment: Judgment normal.     Assessment & Plan:  Helen Dorsey was seen today for hospitalization follow-up and medication refill.  Diagnoses and all orders for this visit:  Type 2 diabetes mellitus without complication, without long-term current use of insulin (HCC) Added metformin 1060m bid , changed regular insulin to SS with parameters, continue Lantus  25 units daily . : Goal of therapy is  Less than 6.5 hemoglobin A1c. Discussed decreasing foods that are high in carbohydrates are the following rice, potatoes, breads, sugars, and pastas.  Reduction in the intake (eating) will assist in lowering your blood sugars. A1C 13 new dx of T2D -     Microalbumin/Creatinine Ratio, Urine -     Glucose (CBG) -     metFORMIN (GLUCOPHAGE) 1000 MG tablet; Take 1 tablet  (1,000 mg total) by mouth 2 (two) times daily with a meal.  Encounter to establish care Establish care with new PCP  Essential hypertension Started low dose of ACE for renal protection Bp is controlled   Comprehensive diabetic foot examination, type 2 DM, encounter for ([facility] Completed   Gastroesophageal reflux disease without esophagitis Discussed eating small frequent meal, reduction in acidic foods, fried foods ,spicy foods, alcohol caffeine and tobacco and certain medications. Avoid laying down after eating 383ms-1hour, elevated head of the bed.  -     omeprazole (PRILOSEC) 40 MG capsule; Take 1 capsule (40 mg total) by mouth daily.  Other orders -     atorvastatin (LIPITOR) 10 MG tablet; Take 1 tablet (10 mg total) by mouth daily. -     insulin aspart (NOVOLOG) 100 UNIT/ML FlexPen; For blood sugars 0-150 give 0 units of insulin, -200 -250  Give 4 units of insulin,251-300  give 6 units, 301-350 give 8 units,  351 -400 give 10  .  401 > 12 units and call M.D. Discussed hypoglycemia protocol. -     insulin glargine (LANTUS) 100 UNIT/ML Solostar Pen; Inject 25 Units into the skin daily.   Outpatient Encounter Medications as of 11/22/2020  Medication Sig  . atorvastatin (LIPITOR) 10 MG tablet Take 1 tablet (10 mg total) by mouth daily.  . blood glucose meter kit and supplies KIT Dispense based on patient and insurance preference. Use up to four times daily as directed. (FOR ICD-9 250.00, 250.01).  . Insulin Pen Needle (PEN NEEDLES) 33G X 4 MM MISC 1 each by Does not apply route in the morning, at noon, in the evening, and at bedtime.  . metFORMIN (GLUCOPHAGE) 1000 MG tablet Take 1 tablet (1,000 mg total) by mouth 2 (two) times daily with a meal.  . [DISCONTINUED] insulin aspart (NOVOLOG) 100 UNIT/ML FlexPen Inject 5 Units into the skin 3 (three) times daily with meals.  . [DISCONTINUED] insulin glargine (LANTUS) 100 UNIT/ML Solostar Pen Inject 25 Units into the skin daily.  .  [DISCONTINUED] omeprazole (PRILOSEC) 40 MG capsule Take 1 capsule (40 mg total) by mouth daily.  . insulin aspart (NOVOLOG) 100 UNIT/ML FlexPen For blood sugars 0-150 give 0 units of insulin, -200 -250  Give 4 units of insulin,251-300  give 6 units, 301-350 give 8 units,  351 -400 give 10  .  401 > 12 units and call M.D. Discussed hypoglycemia protocol.  . insulin glargine (LANTUS) 100 UNIT/ML Solostar Pen Inject 25 Units into the skin daily.  . Marland Kitchenmeprazole (PRILOSEC) 40 MG capsule Take 1 capsule (  40 mg total) by mouth daily.  . [DISCONTINUED] fluconazole (DIFLUCAN) 100 MG tablet Take 1 tablet (100 mg total) by mouth daily.   No facility-administered encounter medications on file as of 11/22/2020.    Follow-up: Return in about 3 months (around 02/19/2021) for DM in person .   Kerin Perna, NP

## 2020-11-22 NOTE — Patient Instructions (Signed)
Diabetes Mellitus and Nutrition, Adult When you have diabetes, or diabetes mellitus, it is very important to have healthy eating habits because your blood sugar (glucose) levels are greatly affected by what you eat and drink. Eating healthy foods in the right amounts, at about the same times every day, can help you:  Control your blood glucose.  Lower your risk of heart disease.  Improve your blood pressure.  Reach or maintain a healthy weight. What can affect my meal plan? Every person with diabetes is different, and each person has different needs for a meal plan. Your health care provider may recommend that you work with a dietitian to make a meal plan that is best for you. Your meal plan may vary depending on factors such as:  The calories you need.  The medicines you take.  Your weight.  Your blood glucose, blood pressure, and cholesterol levels.  Your activity level.  Other health conditions you have, such as heart or kidney disease. How do carbohydrates affect me? Carbohydrates, also called carbs, affect your blood glucose level more than any other type of food. Eating carbs naturally raises the amount of glucose in your blood. Carb counting is a method for keeping track of how many carbs you eat. Counting carbs is important to keep your blood glucose at a healthy level, especially if you use insulin or take certain oral diabetes medicines. It is important to know how many carbs you can safely have in each meal. This is different for every person. Your dietitian can help you calculate how many carbs you should have at each meal and for each snack. How does alcohol affect me? Alcohol can cause a sudden decrease in blood glucose (hypoglycemia), especially if you use insulin or take certain oral diabetes medicines. Hypoglycemia can be a life-threatening condition. Symptoms of hypoglycemia, such as sleepiness, dizziness, and confusion, are similar to symptoms of having too much  alcohol.  Do not drink alcohol if: ? Your health care provider tells you not to drink. ? You are pregnant, may be pregnant, or are planning to become pregnant.  If you drink alcohol: ? Do not drink on an empty stomach. ? Limit how much you use to:  0-1 drink a day for women.  0-2 drinks a day for men. ? Be aware of how much alcohol is in your drink. In the [place] one drink equals one 12 oz bottle of beer (355 mL), one 5 oz glass of wine (148 mL), or one 1 oz glass of hard liquor (44 mL). ? Keep yourself hydrated with water, diet soda, or unsweetened iced tea.  Keep in mind that regular soda, juice, and other mixers may contain a lot of sugar and must be counted as carbs. What are tips for following this plan? Reading food labels  Start by checking the serving size on the "Nutrition Facts" label of packaged foods and drinks. The amount of calories, carbs, fats, and other nutrients listed on the label is based on one serving of the item. Many items contain more than one serving per package.  Check the total grams (g) of carbs in one serving. You can calculate the number of servings of carbs in one serving by dividing the total carbs by 15. For example, if a food has 30 g of total carbs per serving, it would be equal to 2 servings of carbs.  Check the number of grams (g) of saturated fats and trans fats in one serving. Choose foods that have   a low amount or none of these fats.  Check the number of milligrams (mg) of salt (sodium) in one serving. Most people should limit total sodium intake to less than 2,300 mg per day.  Always check the nutrition information of foods labeled as "low-fat" or "nonfat." These foods may be higher in added sugar or refined carbs and should be avoided.  Talk to your dietitian to identify your daily goals for nutrients listed on the label. Shopping  Avoid buying canned, pre-made, or processed foods. These foods tend to be high in fat, sodium, and added  sugar.  Shop around the outside edge of the grocery store. This is where you will most often find fresh fruits and vegetables, bulk grains, fresh meats, and fresh dairy. Cooking  Use low-heat cooking methods, such as baking, instead of high-heat cooking methods like deep frying.  Cook using healthy oils, such as olive, canola, or sunflower oil.  Avoid cooking with butter, cream, or high-fat meats. Meal planning  Eat meals and snacks regularly, preferably at the same times every day. Avoid going long periods of time without eating.  Eat foods that are high in fiber, such as fresh fruits, vegetables, beans, and whole grains. Talk with your dietitian about how many servings of carbs you can eat at each meal.  Eat 4-6 oz (112-168 g) of lean protein each day, such as lean meat, chicken, fish, eggs, or tofu. One ounce (oz) of lean protein is equal to: ? 1 oz (28 g) of meat, chicken, or fish. ? 1 egg. ?  cup (62 g) of tofu.  Eat some foods each day that contain healthy fats, such as avocado, nuts, seeds, and fish.   What foods should I eat? Fruits Berries. Apples. Oranges. Peaches. Apricots. Plums. Grapes. Mango. Papaya. Pomegranate. Kiwi. Cherries. Vegetables Lettuce. Spinach. Leafy greens, including kale, chard, collard greens, and mustard greens. Beets. Cauliflower. Cabbage. Broccoli. Carrots. Green beans. Tomatoes. Peppers. Onions. Cucumbers. Brussels sprouts. Grains Whole grains, such as whole-wheat or whole-grain bread, crackers, tortillas, cereal, and pasta. Unsweetened oatmeal. Quinoa. Brown or wild rice. Meats and other proteins Seafood. Poultry without skin. Lean cuts of poultry and beef. Tofu. Nuts. Seeds. Dairy Low-fat or fat-free dairy products such as milk, yogurt, and cheese. The items listed above may not be a complete list of foods and beverages you can eat. Contact a dietitian for more information. What foods should I avoid? Fruits Fruits canned with  syrup. Vegetables Canned vegetables. Frozen vegetables with butter or cream sauce. Grains Refined white flour and flour products such as bread, pasta, snack foods, and cereals. Avoid all processed foods. Meats and other proteins Fatty cuts of meat. Poultry with skin. Breaded or fried meats. Processed meat. Avoid saturated fats. Dairy Full-fat yogurt, cheese, or milk. Beverages Sweetened drinks, such as soda or iced tea. The items listed above may not be a complete list of foods and beverages you should avoid. Contact a dietitian for more information. Questions to ask a health care provider  Do I need to meet with a diabetes educator?  Do I need to meet with a dietitian?  What number can I call if I have questions?  When are the best times to check my blood glucose? Where to find more information:  American Diabetes Association: diabetes.org  Academy of Nutrition and Dietetics: www.eatright.org  National Institute of Diabetes and Digestive and Kidney Diseases: www.niddk.nih.gov  Association of Diabetes Care and Education Specialists: www.diabeteseducator.org Summary  It is important to have healthy eating   habits because your blood sugar (glucose) levels are greatly affected by what you eat and drink.  A healthy meal plan will help you control your blood glucose and maintain a healthy lifestyle.  Your health care provider may recommend that you work with a dietitian to make a meal plan that is best for you.  Keep in mind that carbohydrates (carbs) and alcohol have immediate effects on your blood glucose levels. It is important to count carbs and to use alcohol carefully. This information is not intended to replace advice given to you by your health care provider. Make sure you discuss any questions you have with your health care provider. Document Revised: 09/06/2019 Document Reviewed: 09/06/2019 Elsevier Patient Education  2021 Elsevier Inc.  

## 2020-11-23 ENCOUNTER — Ambulatory Visit (INDEPENDENT_AMBULATORY_CARE_PROVIDER_SITE_OTHER): Payer: Self-pay | Admitting: *Deleted

## 2020-11-23 LAB — MICROALBUMIN / CREATININE URINE RATIO
Creatinine, Urine: 137.5 mg/dL
Microalb/Creat Ratio: 5 mg/g creat (ref 0–29)
Microalbumin, Urine: 6.9 ug/mL

## 2020-11-23 NOTE — Telephone Encounter (Signed)
Spoke with patient and reviewed sliding scale instructions with her. She is aware that CBG of 198 she does not inject any sliding scale insulin. She verbalized understanding.

## 2020-11-23 NOTE — Telephone Encounter (Signed)
Patient called requesting clarification of sliding scale orders for insulin from office visit yesterday. Patient is requesting to know how much insulin does she give herself when her BS is 151-199? Blood glucose now is 198. Patient reports she will also check her My Chart messages for PCP response if she does not answer a phone call from clinic or can leave on voicemail.  Care advise given. Patient verbalized understanding of care advise and to call back if needed. Attempted to call clinic to notify PCP and no answer at this time and did not leave voicemail.   Reason for Disposition . [1] Caller requesting NON-URGENT health information AND [2] PCP's office is the best resource  Answer Assessment - Initial Assessment Questions 1. REASON FOR CALL or QUESTION: "What is your reason for calling today?" or "How can I best help you?" or "What question do you have that I can help answer?"    How many units of insulin do I give myself if for sliding scale of 151-199?  Protocols used: INFORMATION ONLY CALL - NO TRIAGE-A-AH

## 2020-12-03 ENCOUNTER — Other Ambulatory Visit (INDEPENDENT_AMBULATORY_CARE_PROVIDER_SITE_OTHER): Payer: Self-pay | Admitting: Primary Care

## 2020-12-03 DIAGNOSIS — K219 Gastro-esophageal reflux disease without esophagitis: Secondary | ICD-10-CM

## 2020-12-03 MED ORDER — OMEPRAZOLE 40 MG PO CPDR: 40.0000 mg | DELAYED_RELEASE_CAPSULE | Freq: Every day | ORAL | 2 refills | Status: DC

## 2020-12-03 NOTE — Telephone Encounter (Signed)
Pt would like you to send her omeprazole (PRILOSEC) 40 MG capsule  To a different pharmacy: [facility] [phone] - [place], [place] - [address]  She said [facility] told her to call her dr.

## 2020-12-03 NOTE — Telephone Encounter (Signed)
Called to see if prescription was picked up by pt; Spoke with Alinda Money, Pharmacist at [facility]; he says the script from 11/22/20  was originally sent to [facility]; will send prescription to requested pharmacy.  Requested Prescriptions  Pending Prescriptions Disp Refills  . omeprazole (PRILOSEC) 40 MG capsule 30 capsule 2    Sig: Take 1 capsule (40 mg total) by mouth daily.     Gastroenterology: Proton Pump Inhibitors Passed - 12/03/2020 12:44 PM      Passed - Valid encounter within last 12 months    Recent Outpatient Visits          1 week ago Type 2 diabetes mellitus without complication, without long-term current use of insulin (HCC)   [facility] Grayce Sessions, NP      Future Appointments            In 2 months Randa Evens, Kinnie Scales, NP [facility]

## 2020-12-10 ENCOUNTER — Other Ambulatory Visit (INDEPENDENT_AMBULATORY_CARE_PROVIDER_SITE_OTHER): Payer: Self-pay | Admitting: Primary Care

## 2020-12-10 MED ORDER — INSULIN GLARGINE 100 UNIT/ML SOLOSTAR PEN: 25.0000 [IU] | PEN_INJECTOR | Freq: Every day | SUBCUTANEOUS | 1 refills | Status: DC

## 2020-12-10 NOTE — Telephone Encounter (Signed)
Medication Refill - Medication: insulin glargine (LANTUS) 100 UNIT/ML Solostar Pen     Preferred Pharmacy (with phone number or street name):  [facility] [phone] - [place], [place] - [address] Phone:  [phone]  Fax:  [phone]       Agent: Please be advised that RX refills may take up to 3 business days. We ask that you follow-up with your pharmacy.

## 2020-12-10 NOTE — Telephone Encounter (Signed)
Requested Prescriptions  Pending Prescriptions Disp Refills  . insulin glargine (LANTUS) 100 UNIT/ML Solostar Pen 15 mL 1    Sig: Inject 25 Units into the skin daily.     Endocrinology:  Diabetes - Insulins Failed - 12/10/2020 10:02 AM      Failed - HBA1C is between 0 and 7.9 and within 180 days    Hgb A1c MFr Bld  Date Value Ref Range Status  11/08/2020 13.0 (H) 4.8 - 5.6 % Final    Comment:    REPEATED TO VERIFY (NOTE) Pre diabetes:          5.7%-6.4%  Diabetes:              >6.4%  Glycemic control for   <7.0% adults with diabetes          Passed - Valid encounter within last 6 months    Recent Outpatient Visits          2 weeks ago Type 2 diabetes mellitus without complication, without long-term current use of insulin (HCC)   [facility] Grayce Sessions, NP      Future Appointments            In 2 months Randa Evens, Kinnie Scales, NP [facility]

## 2020-12-12 ENCOUNTER — Telehealth (INDEPENDENT_AMBULATORY_CARE_PROVIDER_SITE_OTHER): Payer: Self-pay | Admitting: Primary Care

## 2020-12-12 NOTE — Telephone Encounter (Signed)
Not covered by insurance please send alternate.

## 2020-12-12 NOTE — Telephone Encounter (Signed)
Patient called to inform the doctor that when she went to get her medication for insulin glargine (LANTUS) 100 UNIT/ML Solostar Pen, [employer] stated they would not cover it.  Patient needs an alternative medication that is accepted with her insurance.  Please advise and call patient to discuss at [phone]

## 2020-12-14 ENCOUNTER — Other Ambulatory Visit (INDEPENDENT_AMBULATORY_CARE_PROVIDER_SITE_OTHER): Payer: Self-pay | Admitting: Primary Care

## 2020-12-14 DIAGNOSIS — E119 Type 2 diabetes mellitus without complications: Secondary | ICD-10-CM

## 2020-12-14 MED ORDER — BASAGLAR KWIKPEN 100 UNIT/ML ~~LOC~~ SOPN: 15.0000 [IU] | PEN_INJECTOR | Freq: Every day | SUBCUTANEOUS | 6 refills | Status: DC

## 2021-01-02 DIAGNOSIS — N915 Oligomenorrhea, unspecified: Secondary | ICD-10-CM | POA: Diagnosis not present

## 2021-01-02 DIAGNOSIS — Z01419 Encounter for gynecological examination (general) (routine) without abnormal findings: Secondary | ICD-10-CM | POA: Diagnosis not present

## 2021-01-02 DIAGNOSIS — Z6841 Body Mass Index (BMI) 40.0 and over, adult: Secondary | ICD-10-CM | POA: Diagnosis not present

## 2021-01-02 DIAGNOSIS — R87810 Cervical high risk human papillomavirus (HPV) DNA test positive: Secondary | ICD-10-CM | POA: Diagnosis not present

## 2021-01-05 ENCOUNTER — Encounter (HOSPITAL_COMMUNITY): Payer: Self-pay

## 2021-01-05 ENCOUNTER — Ambulatory Visit (HOSPITAL_COMMUNITY)
Admission: EM | Admit: 2021-01-05 | Discharge: 2021-01-05 | Disposition: A | Discharge status: Home / Self Care | Payer: BC Managed Care – PPO

## 2021-01-05 ENCOUNTER — Other Ambulatory Visit: Payer: Self-pay

## 2021-01-05 ENCOUNTER — Ambulatory Visit (INDEPENDENT_AMBULATORY_CARE_PROVIDER_SITE_OTHER): Payer: BC Managed Care – PPO

## 2021-01-05 DIAGNOSIS — M25532 Pain in left wrist: Secondary | ICD-10-CM | POA: Diagnosis not present

## 2021-01-05 DIAGNOSIS — M7989 Other specified soft tissue disorders: Secondary | ICD-10-CM | POA: Diagnosis not present

## 2021-01-05 DIAGNOSIS — M25432 Effusion, left wrist: Secondary | ICD-10-CM

## 2021-01-05 HISTORY — DX: Type 2 diabetes mellitus without complications: E11.9

## 2021-01-05 MED ORDER — CYCLOBENZAPRINE HCL 5 MG PO TABS: 5.0000 mg | ORAL_TABLET | Freq: Three times a day (TID) | ORAL | 0 refills | Status: DC | PRN

## 2021-01-05 MED ORDER — NAPROXEN 500 MG PO TABS: 500.0000 mg | ORAL_TABLET | Freq: Two times a day (BID) | ORAL | 0 refills | Status: DC | PRN

## 2021-01-05 NOTE — ED Triage Notes (Signed)
Pt in with c/o left wrist pain that started yesterday morning   Pt has taken ibuprofen with no relief

## 2021-01-05 NOTE — ED Provider Notes (Signed)
[name]    CSN: [id number] Arrival date & time: 01/05/21  1112      History   Chief Complaint Chief Complaint  Patient presents with  . Wrist Pain    HPI Helen Dorsey is a 30 y.o. female.   Patient presenting today with 1 day history of acute onset left wrist pain and swelling without known injury.  Has good motion but it is very stiff and painful.  Denies numbness tingling discoloration or warmth to the area.  No skin changes to the area.  Trying ibuprofen and Excedrin without any relief at this time.  No known history of orthopedic issues in the past.     Past Medical History:  Diagnosis Date  . Diabetes mellitus without complication [facility])     Patient Active Problem List   Diagnosis Date Noted  . DKA (diabetic ketoacidosis) ([place]) 11/08/2020    History reviewed. No pertinent surgical history.  OB History   No obstetric history on file.      Home Medications    Prior to Admission medications   Medication Sig Start Date End Date Taking? Authorizing Provider  cyclobenzaprine (FLEXERIL) 5 MG tablet Take 1 tablet (5 mg total) by mouth 3 (three) times daily as needed for muscle spasms. 01/05/21  Yes Volney American, PA-C  naproxen (NAPROSYN) 500 MG tablet Take 1 tablet (500 mg total) by mouth 2 (two) times daily as needed. 01/05/21  Yes Volney American, PA-C  atorvastatin (LIPITOR) 10 MG tablet Take 1 tablet (10 mg total) by mouth daily. 11/22/20   Kerin Perna, NP  blood glucose meter kit and supplies KIT Dispense based on patient and insurance preference. Use up to four times daily as directed. (FOR ICD-9 250.00, 250.01). 11/09/20   Caren Griffins, MD  insulin aspart (NOVOLOG) 100 UNIT/ML FlexPen For blood sugars 0-150 give 0 units of insulin, -200 -250  Give 4 units of insulin,251-300  give 6 units, 301-350 give 8 units,  351 -400 give 10  .  401 > 12 units and call M.D. Discussed hypoglycemia protocol. 11/22/20   Kerin Perna, NP  Insulin Glargine (BASAGLAR KWIKPEN) 100 UNIT/ML Inject 15 Units into the skin daily. 12/14/20   Kerin Perna, NP  Insulin Pen Needle (PEN NEEDLES) 33G X 4 MM MISC 1 each by Does not apply route in the morning, at noon, in the evening, and at bedtime. 11/09/20   Caren Griffins, MD  metFORMIN (GLUCOPHAGE) 1000 MG tablet Take 1 tablet (1,000 mg total) by mouth 2 (two) times daily with a meal. 11/22/20   Kerin Perna, NP  norethindrone (AYGESTIN) 5 MG tablet Take 5 mg by mouth daily. 11/16/20   [provider]  omeprazole (PRILOSEC) 40 MG capsule Take 1 capsule (40 mg total) by mouth daily. 12/03/20   Kerin Perna, NP    Family History History reviewed. No pertinent family history.  Social History Social History   Tobacco Use  . Smoking status: Current Every Day Smoker  . Smokeless tobacco: Never Used  Vaping Use  . Vaping Use: Never used  Substance Use Topics  . Alcohol use: Yes    Comment: occasionally  . Drug use: No     Allergies   Patient has no known allergies.   Review of Systems Review of Systems Per HPI Physical Exam Triage Vital Signs ED Triage Vitals  Enc Vitals Group     BP 01/05/21 1135 (!) 135/101  Pulse Rate 01/05/21 1135 87     Resp 01/05/21 1135 18     Temp 01/05/21 1135 97.8 F (36.6 C)     Temp src --      SpO2 01/05/21 1135 98 %     Weight --      Height --      Head Circumference --      Peak Flow --      Pain Score 01/05/21 1133 10     Pain Loc --      Pain Edu? --      Excl. in [place]? --    No data found.  Updated Vital Signs BP (!) 135/101   Pulse 87   Temp 97.8 F (36.6 C)   Resp 18   SpO2 98%   Visual Acuity Right Eye Distance:   Left Eye Distance:   Bilateral Distance:    Right Eye Near:   Left Eye Near:    Bilateral Near:     Physical Exam Vitals and nursing note reviewed.  Constitutional:      Appearance: Normal appearance. She is not ill-appearing.  HENT:     Head:  Atraumatic.     Mouth/Throat:     Mouth: Mucous membranes are moist.  Eyes:     Extraocular Movements: Extraocular movements intact.     Conjunctiva/sclera: Conjunctivae normal.  Cardiovascular:     Rate and Rhythm: Normal rate and regular rhythm.     Heart sounds: Normal heart sounds.  Pulmonary:     Effort: Pulmonary effort is normal.     Breath sounds: Normal breath sounds.  Abdominal:     General: Bowel sounds are normal. There is no distension.     Palpations: Abdomen is soft.     Tenderness: There is no abdominal tenderness. There is no guarding.  Musculoskeletal:        General: Swelling and tenderness present. Normal range of motion.     Cervical back: Normal range of motion and neck supple.     Comments: Localized edema and moderate tenderness palpation left extensor surface of wrist  Skin:    General: Skin is warm and dry.     Findings: No bruising, erythema, lesion or rash.  Neurological:     Mental Status: She is alert and oriented to person, place, and time.  Psychiatric:        Mood and Affect: Mood normal.        Thought Content: Thought content normal.        Judgment: Judgment normal.      UC Treatments / Results  Labs (all labs ordered are listed, but only abnormal results are displayed) Labs Reviewed - No data to display  EKG   Radiology DG Wrist Complete Left  Result Date: 01/05/2021 CLINICAL DATA:  LEFT wrist pain and swelling for 1 day, no known injury. No relief with ibuprofen EXAM: LEFT WRIST - COMPLETE 3+ VIEW COMPARISON:  None FINDINGS: Osseous mineralization normal. Joint spaces preserved. Dorsal soft tissue swelling overlying the carpus. No acute fracture, dislocation, or bone destruction. No erosive changes seen. IMPRESSION: Dorsal soft tissue swelling without osseous abnormality. Electronically Signed   By: Lavonia Dana M.D.   On: 01/05/2021 12:18    Procedures Procedures (including critical care time)  Medications Ordered in  UC Medications - No data to display  Initial Impression / Assessment and Plan / UC Course  I have reviewed the triage vital signs and the nursing notes.  Pertinent labs &  imaging results that were available during my care of the patient were reviewed by me and considered in my medical decision making (see chart for details).     Left wrist x-ray negative for bony abnormality, discussed naproxen, Flexeril, ice, elevation, rest.  Work note given.  Follow-up with sports medicine if not resolving.  Final Clinical Impressions(s) / UC Diagnoses   Final diagnoses:  Left wrist pain   Discharge Instructions   None    ED Prescriptions    Medication Sig Dispense Auth. Provider   naproxen (NAPROSYN) 500 MG tablet Take 1 tablet (500 mg total) by mouth 2 (two) times daily as needed. 30 tablet Volney American, [place]   cyclobenzaprine (FLEXERIL) 5 MG tablet Take 1 tablet (5 mg total) by mouth 3 (three) times daily as needed for muscle spasms. 15 tablet Volney American, [place]     PDMP not reviewed this encounter.   Volney American, [place] 01/05/21 1425

## 2021-02-08 ENCOUNTER — Ambulatory Visit (INDEPENDENT_AMBULATORY_CARE_PROVIDER_SITE_OTHER): Payer: BC Managed Care – PPO | Admitting: Primary Care

## 2021-02-08 ENCOUNTER — Encounter (INDEPENDENT_AMBULATORY_CARE_PROVIDER_SITE_OTHER): Payer: Self-pay

## 2021-02-21 ENCOUNTER — Ambulatory Visit (INDEPENDENT_AMBULATORY_CARE_PROVIDER_SITE_OTHER): Payer: Self-pay | Admitting: Primary Care

## 2021-03-13 ENCOUNTER — Ambulatory Visit (INDEPENDENT_AMBULATORY_CARE_PROVIDER_SITE_OTHER): Payer: BC Managed Care – PPO | Admitting: Primary Care

## 2021-03-13 ENCOUNTER — Other Ambulatory Visit: Payer: Self-pay

## 2021-03-13 ENCOUNTER — Encounter (INDEPENDENT_AMBULATORY_CARE_PROVIDER_SITE_OTHER): Payer: Self-pay | Admitting: Primary Care

## 2021-03-13 VITALS — BP 127/92 | HR 82 | Temp 97.3°F | Ht 62.0 in | Wt 236.2 lb

## 2021-03-13 DIAGNOSIS — I10 Essential (primary) hypertension: Secondary | ICD-10-CM

## 2021-03-13 DIAGNOSIS — F17200 Nicotine dependence, unspecified, uncomplicated: Secondary | ICD-10-CM | POA: Diagnosis not present

## 2021-03-13 DIAGNOSIS — E119 Type 2 diabetes mellitus without complications: Secondary | ICD-10-CM

## 2021-03-13 DIAGNOSIS — Z794 Long term (current) use of insulin: Secondary | ICD-10-CM

## 2021-03-13 LAB — GLUCOSE, POCT (MANUAL RESULT ENTRY): POC Glucose: 124 mg/dl — AB (ref 70–99)

## 2021-03-13 LAB — POCT GLYCOSYLATED HEMOGLOBIN (HGB A1C): Hemoglobin A1C: 6.5 % — AB (ref 4.0–5.6)

## 2021-03-13 MED ORDER — BUPROPION HCL ER (SR) 150 MG PO TB12: 150.0000 mg | ORAL_TABLET | Freq: Two times a day (BID) | ORAL | 1 refills | Status: DC

## 2021-03-13 MED ORDER — BASAGLAR KWIKPEN 100 UNIT/ML ~~LOC~~ SOPN: 10.0000 [IU] | PEN_INJECTOR | Freq: Every day | SUBCUTANEOUS | 6 refills | Status: DC

## 2021-03-13 NOTE — Patient Instructions (Signed)
Calorie Counting for Weight Loss Calories are units of energy. Your body needs a certain number of calories from food to keep going throughout the day. When you eat or drink more calories than your body needs, your body stores the extra calories mostly as fat. When you eat or drink fewer calories than your body needs, your body burns fat to get the energy it needs. Calorie counting means keeping track of how many calories you eat and drink each day. Calorie counting can be helpful if you need to lose weight. If you eat fewer calories than your body needs, you should lose weight. Ask your health care provider what a healthy weight is for you. For calorie counting to work, you will need to eat the right number of calories each day to lose a healthy amount of weight per week. A dietitian can help you figure out how many calories you need in a day and will suggest ways to reach your calorie goal.  A healthy amount of weight to lose each week is usually 1-2 lb (0.5-0.9 kg). This usually means that your daily calorie intake should be reduced by 500-750 calories.  Eating 1,200-1,500 calories a day can help most women lose weight.  Eating 1,500-1,800 calories a day can help most men lose weight. What do I need to know about calorie counting? Work with your health care provider or dietitian to determine how many calories you should get each day. To meet your daily calorie goal, you will need to:  Find out how many calories are in each food that you would like to eat. Try to do this before you eat.  Decide how much of the food you plan to eat.  Keep a food log. Do this by writing down what you ate and how many calories it had. To successfully lose weight, it is important to balance calorie counting with a healthy lifestyle that includes regular activity. Where do I find calorie information? The number of calories in a food can be found on a Nutrition Facts label. If a food does not have a Nutrition Facts  label, try to look up the calories online or ask your dietitian for help. Remember that calories are listed per serving. If you choose to have more than one serving of a food, you will have to multiply the calories per serving by the number of servings you plan to eat. For example, the label on a package of bread might say that a serving size is 1 slice and that there are 90 calories in a serving. If you eat 1 slice, you will have eaten 90 calories. If you eat 2 slices, you will have eaten 180 calories.   How do I keep a food log? After each time that you eat, record the following in your food log as soon as possible:  What you ate. Be sure to include toppings, sauces, and other extras on the food.  How much you ate. This can be measured in cups, ounces, or number of items.  How many calories were in each food and drink.  The total number of calories in the food you ate. Keep your food log near you, such as in a pocket-sized notebook or on an app or website on your mobile phone. Some programs will calculate calories for you and show you how many calories you have left to meet your daily goal. What are some portion-control tips?  Know how many calories are in a serving. This will   help you know how many servings you can have of a certain food.  Use a measuring cup to measure serving sizes. You could also try weighing out portions on a kitchen scale. With time, you will be able to estimate serving sizes for some foods.  Take time to put servings of different foods on your favorite plates or in your favorite bowls and cups so you know what a serving looks like.  Try not to eat straight from a food's packaging, such as from a bag or box. Eating straight from the package makes it hard to see how much you are eating and can lead to overeating. Put the amount you would like to eat in a cup or on a plate to make sure you are eating the right portion.  Use smaller plates, glasses, and bowls for smaller  portions and to prevent overeating.  Try not to multitask. For example, avoid watching TV or using your computer while eating. If it is time to eat, sit down at a table and enjoy your food. This will help you recognize when you are full. It will also help you be more mindful of what and how much you are eating. What are tips for following this plan? Reading food labels  Check the calorie count compared with the serving size. The serving size may be smaller than what you are used to eating.  Check the source of the calories. Try to choose foods that are high in protein, fiber, and vitamins, and low in saturated fat, trans fat, and sodium. Shopping  Read nutrition labels while you shop. This will help you make healthy decisions about which foods to buy.  Pay attention to nutrition labels for low-fat or fat-free foods. These foods sometimes have the same number of calories or more calories than the full-fat versions. They also often have added sugar, starch, or salt to make up for flavor that was removed with the fat.  Make a grocery list of lower-calorie foods and stick to it. Cooking  Try to cook your favorite foods in a healthier way. For example, try baking instead of frying.  Use low-fat dairy products. Meal planning  Use more fruits and vegetables. One-half of your plate should be fruits and vegetables.  Include lean proteins, such as chicken, turkey, and fish. Lifestyle Each week, aim to do one of the following:  150 minutes of moderate exercise, such as walking.  75 minutes of vigorous exercise, such as running. General information  Know how many calories are in the foods you eat most often. This will help you calculate calorie counts faster.  Find a way of tracking calories that works for you. Get creative. Try different apps or programs if writing down calories does not work for you. What foods should I eat?  Eat nutritious foods. It is better to have a nutritious,  high-calorie food, such as an avocado, than a food with few nutrients, such as a bag of potato chips.  Use your calories on foods and drinks that will fill you up and will not leave you hungry soon after eating. ? Examples of foods that fill you up are nuts and nut butters, vegetables, lean proteins, and high-fiber foods such as whole grains. High-fiber foods are foods with more than 5 g of fiber per serving.  Pay attention to calories in drinks. Low-calorie drinks include water and unsweetened drinks. The items listed above may not be a complete list of foods and beverages you can eat.   Contact a dietitian for more information.   What foods should I limit? Limit foods or drinks that are not good sources of vitamins, minerals, or protein or that are high in unhealthy fats. These include:  Candy.  Other sweets.  Sodas, specialty coffee drinks, alcohol, and juice. The items listed above may not be a complete list of foods and beverages you should avoid. Contact a dietitian for more information. How do I count calories when eating out?  Pay attention to portions. Often, portions are much larger when eating out. Try these tips to keep portions smaller: ? Consider sharing a meal instead of getting your own. ? If you get your own meal, eat only half of it. Before you start eating, ask for a container and put half of your meal into it. ? When available, consider ordering smaller portions from the menu instead of full portions.  Pay attention to your food and drink choices. Knowing the way food is cooked and what is included with the meal can help you eat fewer calories. ? If calories are listed on the menu, choose the lower-calorie options. ? Choose dishes that include vegetables, fruits, whole grains, low-fat dairy products, and lean proteins. ? Choose items that are boiled, broiled, grilled, or steamed. Avoid items that are buttered, battered, fried, or served with cream sauce. Items labeled as  crispy are usually fried, unless stated otherwise. ? Choose water, low-fat milk, unsweetened iced tea, or other drinks without added sugar. If you want an alcoholic beverage, choose a lower-calorie option, such as a glass of wine or light beer. ? Ask for dressings, sauces, and syrups on the side. These are usually high in calories, so you should limit the amount you eat. ? If you want a salad, choose a garden salad and ask for grilled meats. Avoid extra toppings such as bacon, cheese, or fried items. Ask for the dressing on the side, or ask for olive oil and vinegar or lemon to use as dressing.  Estimate how many servings of a food you are given. Knowing serving sizes will help you be aware of how much food you are eating at restaurants. Where to find more information  Centers for Disease Control and Prevention: www.cdc.gov  [place] Department of Agriculture: myplate.gov Summary  Calorie counting means keeping track of how many calories you eat and drink each day. If you eat fewer calories than your body needs, you should lose weight.  A healthy amount of weight to lose per week is usually 1-2 lb (0.5-0.9 kg). This usually means reducing your daily calorie intake by 500-750 calories.  The number of calories in a food can be found on a Nutrition Facts label. If a food does not have a Nutrition Facts label, try to look up the calories online or ask your dietitian for help.  Use smaller plates, glasses, and bowls for smaller portions and to prevent overeating.  Use your calories on foods and drinks that will fill you up and not leave you hungry shortly after a meal. This information is not intended to replace advice given to you by your health care provider. Make sure you discuss any questions you have with your health care provider. Document Revised: 11/10/2019 Document Reviewed: 11/10/2019 Elsevier Patient Education  2021 Elsevier Inc.  

## 2021-03-13 NOTE — Progress Notes (Signed)
Subjective:  Patient ID: Helen Dorsey, female    DOB: [birth year]  Age: 30 y.o. MRN: [id number]  CC: Follow-up (Diabetes )   HPI Helen Barefoot Ms. presents for follow-up of diabetes. Patient does  check blood sugar at home  Compliant with meds - Yes Checking CBGs? Yes  Fasting avg - 100-130  Postprandial average -  Exercising regularly? - Yes Watching carbohydrate intake? - Yes Neuropathy ? - No Hypoglycemic events - No  - Recovers with :   Pertinent ROS:  Polyuria - No Polydipsia - No Vision problems - No  Medications as noted below. Taking them regularly without complication/adverse reaction being reported today.   History Helen Dorsey has a past medical history of Diabetes mellitus without complication ([place]).   She has no past surgical history on file.   Her family history is not on file.She reports that she has been smoking. She has never used smokeless tobacco. She reports current alcohol use. She reports that she does not use drugs.  Current Outpatient Medications on File Prior to Visit  Medication Sig Dispense Refill  . atorvastatin (LIPITOR) 10 MG tablet Take 1 tablet (10 mg total) by mouth daily. 90 tablet 3  . Blood Glucose Monitoring Suppl (TRUE METRIX METER) w/Device KIT USE UP TO 4 TIMES DAILY AS DIRECTED 1 kit 0  . TRUEplus Lancets 28G MISC USE UP TO 4 TIMES DAILY AS DIRECTED 100 each 0  . glucose blood test strip USE UP TO 4 TIMES DAILY AS DIRECTED 100 strip 0  . insulin aspart (NOVOLOG) 100 UNIT/ML FlexPen For blood sugars 0-150 give 0 units of insulin, -200 -250  Give 4 units of insulin,251-300  give 6 units, 301-350 give 8 units,  351 -400 give 10  .  401 > 12 units and call M.D. Discussed hypoglycemia protocol. (Patient not taking: Reported on 03/13/2021) 15 mL 1  . Insulin Pen Needle (PEN NEEDLES) 33G X 4 MM MISC 1 each by Does not apply route in the morning, at noon, in the evening, and at bedtime. 100 each 0  . Insulin Pen Needle 32G X 4  MM MISC USE AS DIRECTED IN THE MORNING, AT NOON, IN THE EVENING, AND AT BEDTIME. 100 each 0  . norethindrone (AYGESTIN) 5 MG tablet Take 5 mg by mouth daily.    Marland Kitchen omeprazole (PRILOSEC) 40 MG capsule Take 1 capsule (40 mg total) by mouth daily. (Patient not taking: Reported on 03/13/2021) 30 capsule 2   No current facility-administered medications on file prior to visit.    ROS Review of Systems  All other systems reviewed and are negative.   Objective:  BP (!) 127/92 (BP Location: Right Arm, Patient Position: Sitting, Cuff Size: Large)   Pulse 82   Temp (!) 97.3 F (36.3 C) (Temporal)   Ht _0  (1.575 m)   Wt 236 lb 3.2 oz (107.1 kg)   LMP 02/23/2021   SpO2 96%   BMI 43.20 kg/m   BP Readings from Last 3 Encounters:  03/13/21 (!) 127/92  01/05/21 (!) 135/101  11/22/20 124/89    Wt Readings from Last 3 Encounters:  03/13/21 236 lb 3.2 oz (107.1 kg)  11/22/20 238 lb 12.8 oz (108.3 kg)  11/10/20 230 lb (104.3 kg)    Physical Exam Vitals reviewed.  Constitutional:      Appearance: She is obese.  HENT:     Head: Normocephalic.     Right Ear: External ear normal.     Left Ear:  External ear normal.  Eyes:     Extraocular Movements: Extraocular movements intact.  Cardiovascular:     Rate and Rhythm: Normal rate and regular rhythm.  Pulmonary:     Effort: Pulmonary effort is normal.     Breath sounds: Normal breath sounds.  Abdominal:     General: Bowel sounds are normal.     Palpations: Abdomen is soft.  Musculoskeletal:        General: Normal range of motion.     Cervical back: Normal range of motion and neck supple.  Neurological:     Mental Status: She is alert and oriented to person, place, and time.  Psychiatric:        Mood and Affect: Mood normal.        Behavior: Behavior normal.        Thought Content: Thought content normal.        Judgment: Judgment normal.     Lab Results  Component Value Date   HGBA1C 6.5 (A) 03/13/2021   HGBA1C 13.0 (H)  11/08/2020    Lab Results  Component Value Date   WBC 8.3 11/10/2020   HGB 12.1 11/10/2020   HCT 34.1 (L) 11/10/2020   PLT 203 11/10/2020   GLUCOSE 314 (H) 11/10/2020   CHOL 142 11/08/2020   TRIG 129 11/08/2020   HDL 37 (L) 11/08/2020   LDLCALC 79 11/08/2020   ALT 57 (H) 11/08/2020   AST 30 11/08/2020   NA 135 11/10/2020   K 3.1 (L) 11/10/2020   CL 102 11/10/2020   CREATININE 0.97 11/10/2020   BUN 7 11/10/2020   CO2 19 (L) 11/10/2020   HGBA1C 6.5 (A) 03/13/2021     Assessment & Plan:   Shonteria was seen today for follow-up.  Diagnoses and all orders for this visit:  Type 2 diabetes mellitus without complication, with long-term current use of insulin (HCC) Goal of therapy: Less than 6.5 hemoglobin A1c has been met previously 13. She has done a whole life style modification . Monitoring  foods that are high in carbohydrates are the following rice, potatoes, breads, sugars, and pastas.  Reduction in the intake (eating) will assist in lowering your blood sugars. -     HgB A1c 6.5 very pleased  -     Glucose (CBG) -     Insulin Glargine (BASAGLAR KWIKPEN) 100 UNIT/ML; Inject 10 Units into the skin daily.( note dose decreased and discontinued metformin) SSI remains in place if needed  I Essential hypertension Counseled on blood pressure goal of less than 130/80, low-sodium, DASH diet, medication compliance, 150 minutes of moderate intensity exercise per week. Discussed medication compliance, adverse effects.  Morbid obesity ([place]) Morbid Obesity is > 40 indicating an excess in caloric intake or underlining conditions. This may lead to other co-morbidities. Lifestyle modifications of diet and exercise may reduce obesity.  Discussed risk factors to include African-American, morbid obese diabetes, and hypertension-increased risk for heart attack or stroke or both.  She has done a phenomenal job on lowering her A1c with lifestyle changes if this continues she will also start losing  weight.  Follow-up appointment we will discuss options for weight loss  Tobacco dependence Counseled on smoking cessation and increased risk of stroke and heart attack with other comorbidities.  Discussed the option of Wellbutrin that has been proven to help with loss and smoking sensation -     buPROPion (WELLBUTRIN SR) 150 MG 12 hr tablet; Take 1 tablet (150 mg total) by mouth 2 (two)  times daily. Take 1 pill daily for 3 days then take 1 pill twice daily   I have discontinued Megen K. Laplant's metFORMIN, naproxen, and cyclobenzaprine. I have also changed her [place]. Additionally, I am having her start on buPROPion. Lastly, I am having her maintain her Pen Needles, atorvastatin, insulin aspart, omeprazole, norethindrone, TRUEplus Lancets 28G, glucose blood, True Metrix Meter, and Insulin Pen Needle.  Meds ordered this encounter  Medications  . Insulin Glargine (BASAGLAR KWIKPEN) 100 UNIT/ML    Sig: Inject 10 Units into the skin daily.    Dispense:  3 mL    Refill:  6  . buPROPion (WELLBUTRIN SR) 150 MG 12 hr tablet    Sig: Take 1 tablet (150 mg total) by mouth 2 (two) times daily. Take 1 pill daily for 3 days then take 1 pill twice daily    Dispense:  180 tablet    Refill:  1     Follow-up:   Return in about 3 months (around 06/13/2021) for weight management .  The above assessment and management plan was discussed with the patient. The patient verbalized understanding of and has agreed to the management plan. Patient is aware to call the clinic if symptoms fail to improve or worsen. Patient is aware when to return to the clinic for a follow-up visit. Patient educated on when it is appropriate to go to the emergency department.   Juluis Mire, NP-C

## 2021-06-04 DIAGNOSIS — R1031 Right lower quadrant pain: Secondary | ICD-10-CM | POA: Diagnosis not present

## 2021-06-04 DIAGNOSIS — Z6841 Body Mass Index (BMI) 40.0 and over, adult: Secondary | ICD-10-CM | POA: Diagnosis not present

## 2021-06-04 DIAGNOSIS — Z319 Encounter for procreative management, unspecified: Secondary | ICD-10-CM | POA: Diagnosis not present

## 2021-07-02 DIAGNOSIS — R87611 Atypical squamous cells cannot exclude high grade squamous intraepithelial lesion on cytologic smear of cervix (ASC-H): Secondary | ICD-10-CM | POA: Diagnosis not present

## 2021-07-02 DIAGNOSIS — R87610 Atypical squamous cells of undetermined significance on cytologic smear of cervix (ASC-US): Secondary | ICD-10-CM | POA: Diagnosis not present

## 2021-09-16 DIAGNOSIS — Z794 Long term (current) use of insulin: Secondary | ICD-10-CM | POA: Diagnosis not present

## 2021-09-16 DIAGNOSIS — Z6841 Body Mass Index (BMI) 40.0 and over, adult: Secondary | ICD-10-CM | POA: Diagnosis not present

## 2021-09-16 DIAGNOSIS — E1169 Type 2 diabetes mellitus with other specified complication: Secondary | ICD-10-CM | POA: Diagnosis not present

## 2021-10-13 NOTE — L&D Delivery Note (Signed)
OB/GYN Faculty Practice Delivery Note  Helen Dorsey is a 31 y.o. G1P1001 s/p VD at [redacted]w[redacted]d. She was admitted for [facility] and DM2.   ROM: 24h 95m with clear fluid GBS Status:  Negative, Positive/-- (09/09 0000) Maximum Maternal Temperature: 98.81F  Labor Progress: Initial SVE: 0/thick/posterior. She then progressed to complete.   Delivery Date/Time: 06/25/22 5945 Delivery: Called to room and patient was complete and baby head sticking out of vagina. Head delivered ROA. Nuchal cord present x2, tight and reduced. Shoulder and body delivered in usual fashion. Infant noted to be stunned with no spontaneous cry with primary resuscitation, Cord clamped x 2 immediately and given to neonatology team. After resuscitation per NICU, infant placed on mother's abdomen. Cord blood drawn. Placenta delivered spontaneously with gentle cord traction. Fundus firm with massage, LUS, TXA and Pitocin. Labia, perineum, vagina, and cervix inspected with a 1st degree perineal lac noted.  Baby Weight: pending  Placenta: 3 vessel, intact. Sent to L&D Complications: None Lacerations: 1st degree, repaired EBL: 1114 mL (TXA, pitocin, lower uterine sweep) Analgesia: Epidural   Infant:  APGAR (1 MIN):  6 APGAR (5 MINS):  pending  Myrtie Hawk, DO OB Family Medicine Fellow, [facility] for [facility], [facility] 06/25/2022, 6:59 AM

## 2021-11-12 DIAGNOSIS — L811 Chloasma: Secondary | ICD-10-CM | POA: Diagnosis not present

## 2021-11-12 DIAGNOSIS — L821 Other seborrheic keratosis: Secondary | ICD-10-CM | POA: Diagnosis not present

## 2021-12-17 DIAGNOSIS — Z794 Long term (current) use of insulin: Secondary | ICD-10-CM | POA: Diagnosis not present

## 2021-12-17 DIAGNOSIS — Z0000 Encounter for general adult medical examination without abnormal findings: Secondary | ICD-10-CM | POA: Diagnosis not present

## 2021-12-17 DIAGNOSIS — E1169 Type 2 diabetes mellitus with other specified complication: Secondary | ICD-10-CM | POA: Diagnosis not present

## 2022-01-02 DIAGNOSIS — Z309 Encounter for contraceptive management, unspecified: Secondary | ICD-10-CM | POA: Diagnosis not present

## 2022-01-02 DIAGNOSIS — Z01419 Encounter for gynecological examination (general) (routine) without abnormal findings: Secondary | ICD-10-CM | POA: Diagnosis not present

## 2022-01-02 DIAGNOSIS — N915 Oligomenorrhea, unspecified: Secondary | ICD-10-CM | POA: Diagnosis not present

## 2022-01-02 DIAGNOSIS — R87610 Atypical squamous cells of undetermined significance on cytologic smear of cervix (ASC-US): Secondary | ICD-10-CM | POA: Diagnosis not present

## 2022-01-02 DIAGNOSIS — R87810 Cervical high risk human papillomavirus (HPV) DNA test positive: Secondary | ICD-10-CM | POA: Diagnosis not present

## 2022-01-09 DIAGNOSIS — Z3400 Encounter for supervision of normal first pregnancy, unspecified trimester: Secondary | ICD-10-CM | POA: Diagnosis not present

## 2022-01-10 LAB — OB RESULTS CONSOLE ANTIBODY SCREEN: Antibody Screen: NEGATIVE

## 2022-01-10 LAB — OB RESULTS CONSOLE RPR: RPR: NONREACTIVE

## 2022-01-10 LAB — OB RESULTS CONSOLE RUBELLA ANTIBODY, IGM: Rubella: IMMUNE

## 2022-01-10 LAB — OB RESULTS CONSOLE ABO/RH: RH Type: POSITIVE

## 2022-01-10 LAB — OB RESULTS CONSOLE HIV ANTIBODY (ROUTINE TESTING): HIV: NONREACTIVE

## 2022-01-10 LAB — HEPATITIS C ANTIBODY: HCV Ab: NEGATIVE

## 2022-01-10 LAB — OB RESULTS CONSOLE HEPATITIS B SURFACE ANTIGEN: Hepatitis B Surface Ag: NEGATIVE

## 2022-01-13 DIAGNOSIS — N926 Irregular menstruation, unspecified: Secondary | ICD-10-CM | POA: Diagnosis not present

## 2022-01-13 DIAGNOSIS — Z3687 Encounter for antenatal screening for uncertain dates: Secondary | ICD-10-CM | POA: Diagnosis not present

## 2022-01-15 DIAGNOSIS — Z113 Encounter for screening for infections with a predominantly sexual mode of transmission: Secondary | ICD-10-CM | POA: Diagnosis not present

## 2022-01-15 DIAGNOSIS — O24111 Pre-existing diabetes mellitus, type 2, in pregnancy, first trimester: Secondary | ICD-10-CM | POA: Diagnosis not present

## 2022-01-15 DIAGNOSIS — Z3A37 37 weeks gestation of pregnancy: Secondary | ICD-10-CM | POA: Diagnosis not present

## 2022-01-15 DIAGNOSIS — N898 Other specified noninflammatory disorders of vagina: Secondary | ICD-10-CM | POA: Diagnosis not present

## 2022-01-15 LAB — OB RESULTS CONSOLE GC/CHLAMYDIA
Chlamydia: NEGATIVE
Neisseria Gonorrhea: NEGATIVE

## 2022-01-29 ENCOUNTER — Encounter (HOSPITAL_COMMUNITY): Payer: Self-pay

## 2022-01-29 ENCOUNTER — Inpatient Hospital Stay (HOSPITAL_BASED_OUTPATIENT_CLINIC_OR_DEPARTMENT_OTHER): Payer: BC Managed Care – PPO

## 2022-01-29 ENCOUNTER — Inpatient Hospital Stay (HOSPITAL_COMMUNITY)
Admission: AD | Admit: 2022-01-29 | Discharge: 2022-01-29 | Disposition: A | Discharge status: Home / Self Care | Payer: BC Managed Care – PPO | Attending: Obstetrics and Gynecology | Admitting: Obstetrics and Gynecology

## 2022-01-29 ENCOUNTER — Other Ambulatory Visit: Payer: Self-pay

## 2022-01-29 DIAGNOSIS — O4402 Placenta previa specified as without hemorrhage, second trimester: Secondary | ICD-10-CM | POA: Diagnosis not present

## 2022-01-29 DIAGNOSIS — O321XX0 Maternal care for breech presentation, not applicable or unspecified: Secondary | ICD-10-CM

## 2022-01-29 DIAGNOSIS — Z794 Long term (current) use of insulin: Secondary | ICD-10-CM | POA: Insufficient documentation

## 2022-01-29 DIAGNOSIS — Z3A16 16 weeks gestation of pregnancy: Secondary | ICD-10-CM | POA: Diagnosis not present

## 2022-01-29 DIAGNOSIS — O4692 Antepartum hemorrhage, unspecified, second trimester: Secondary | ICD-10-CM

## 2022-01-29 DIAGNOSIS — O99332 Smoking (tobacco) complicating pregnancy, second trimester: Secondary | ICD-10-CM | POA: Insufficient documentation

## 2022-01-29 DIAGNOSIS — O4412 Placenta previa with hemorrhage, second trimester: Secondary | ICD-10-CM | POA: Insufficient documentation

## 2022-01-29 DIAGNOSIS — O209 Hemorrhage in early pregnancy, unspecified: Secondary | ICD-10-CM | POA: Insufficient documentation

## 2022-01-29 LAB — URINALYSIS, ROUTINE W REFLEX MICROSCOPIC
Bilirubin Urine: NEGATIVE
Glucose, UA: NEGATIVE mg/dL
Ketones, ur: NEGATIVE mg/dL
Leukocytes,Ua: NEGATIVE
Nitrite: NEGATIVE
Protein, ur: NEGATIVE mg/dL
Specific Gravity, Urine: 1.018 (ref 1.005–1.030)
pH: 6 (ref 5.0–8.0)

## 2022-01-29 NOTE — MAU Provider Note (Signed)
Chief Complaint:  Vaginal Bleeding ? ? Event Date/Time  ? First Provider Initiated Contact with Patient 01/29/22 [phone]   ?  ?HPI: Helen Dorsey is a 31 y.o. G1P0 at 36w1dwho presents to maternity admissions reporting vaginal bleeding this morning.  Noted it when using bathroom, dark red.  No pain. No prior history of bleeding. ?She denies LOF, vaginal itching/burning, urinary symptoms, h/a, dizziness, n/v or fever. ? ?Vaginal Bleeding ?The patient's primary symptoms include vaginal bleeding. The patient's pertinent negatives include no genital itching, genital lesions, genital odor or pelvic pain. This is a new problem. The current episode started today. The patient is experiencing no pain. She is pregnant. Pertinent negatives include no abdominal pain, back pain, chills, dysuria or fever. The vaginal discharge was bloody. The vaginal bleeding is lighter than menses. She has not been passing clots. She has not been passing tissue. Nothing aggravates the symptoms. She has tried nothing for the symptoms.  ? ?RN Note: ?[name] is a 31 y.o. at [redacted]w[redacted]d here in MAU reporting: Pt reports dark red vaginal bleeding upon waking and using the bathroom. No pain.  ?LMP: unknown ?Onset of complaint: [id number] ?Pain score: 0/10 ? ?Past Medical History: ?Past Medical History:  ?Diagnosis Date  ? Diabetes mellitus without complication ([place])   ? ? ?Past obstetric history: ?OB History  ?Gravida Para Term Preterm AB Living  ?1            ?SAB IAB Ectopic Multiple Live Births  ?           ?  ?# Outcome Date GA Lbr Len/2nd Weight Sex Delivery Anes PTL Lv  ?1 Current           ? ? ?Past Surgical History: ?No past surgical history on file. ? ?Family History: ?No family history on file. ? ?Social History: ?Social History  ? ?Tobacco Use  ? Smoking status: Every Day  ? Smokeless tobacco: Never  ?Vaping Use  ? Vaping Use: Never used  ?Substance Use Topics  ? Alcohol use: Yes  ?  Comment: occasionally  ? Drug use: No   ? ? ?Allergies: No Known Allergies ? ?Meds:  ?Medications Prior to Admission  ?Medication Sig Dispense Refill Last Dose  ? atorvastatin (LIPITOR) 10 MG tablet Take 1 tablet (10 mg total) by mouth daily. 90 tablet 3   ? buPROPion (WELLBUTRIN SR) 150 MG 12 hr tablet Take 1 tablet (150 mg total) by mouth 2 (two) times daily. Take 1 pill daily for 3 days then take 1 pill twice daily 180 tablet 1   ? insulin aspart (NOVOLOG) 100 UNIT/ML FlexPen For blood sugars 0-150 give 0 units of insulin, -200 -250  Give 4 units of insulin,251-300  give 6 units, 301-350 give 8 units,  351 -400 give 10  .  401 > 12 units and call M.D. ?Discussed hypoglycemia protocol. (Patient not taking: Reported on 03/13/2021) 15 mL 1   ? Insulin Glargine (BASAGLAR KWIKPEN) 100 UNIT/ML Inject 10 Units into the skin daily. 3 mL 6   ? Insulin Pen Needle (PEN NEEDLES) 33G X 4 MM MISC 1 each by Does not apply route in the morning, at noon, in the evening, and at bedtime. 100 each 0   ? norethindrone (AYGESTIN) 5 MG tablet Take 5 mg by mouth daily.     ? omeprazole (PRILOSEC) 40 MG capsule Take 1 capsule (40 mg total) by mouth daily. (Patient not taking: Reported on 03/13/2021) 30 capsule 2   ? ? ?  I have reviewed patient's Past Medical Hx, Surgical Hx, Family Hx, Social Hx, medications and allergies.  ? ?ROS:  ?Review of Systems  ?Constitutional:  Negative for chills and fever.  ?Gastrointestinal:  Negative for abdominal pain.  ?Genitourinary:  Positive for vaginal bleeding. Negative for dysuria and pelvic pain.  ?Musculoskeletal:  Negative for back pain.  ?Other systems negative ? ?Physical Exam  ?Patient Vitals for the past 24 hrs: ? BP Temp Temp src Pulse Resp SpO2 Height Weight  ?01/29/22 0611 (!) 138/94 97.8 ?F (36.6 ?C) Oral (!) 114 18 99 % 5\' 2"  (1.575 m) 103.7 kg  ? ?Constitutional: Well-developed, well-nourished female in no acute distress.  ?Cardiovascular: normal rate  ?Respiratory: normal effort ?GI: Abd soft, non-tender, gravid appropriate for  gestational age.   No rebound or guarding. ?MS: Extremities nontender, no edema, normal ROM ?Neurologic: Alert and oriented x 4.  ?GU: Neg CVAT. ? ?PELVIC EXAM: Cervix pink, visually closed, without lesion, single drop of blood noted at closed cervical os, vaginal walls and external genitalia normal ?  ?FHT:  158 ?  ?Labs: ?No results found for this or any previous visit (from the past 24 hour(s)). ?  ?Blood Type is O+ per prenatal records ? ?Imaging:  ?Posterior Placenta Previa ?Cervical length 3.0cm ? ?MAU Course/MDM: ?I have ordered Ultrasound which showed placenta previa  ? ?Consult Dr Landry Mellow with presentation, exam findings and test results. She recommends precautions and followup in office as scheduled ?Treatments in MAU included [place], Speculum exam.   ? ?Assessment: ?Single IUP at [redacted]w[redacted]d ?Bleeding in second trimester ?Posterior placenta previa ? ?Plan: ?Discharge home ?Reviewed diagnosis and recommendations ?Strict pelvic rest and exercise restriction ? ?Follow up in Office as scheduled or PRN  ?Encouraged to return if she develops worsening of symptoms, increase in pain, fever, or other concerning symptoms.  ? ?Pt stable at time of discharge. ? ?Hansel Feinstein CNM, MSN ?Certified Nurse-Midwife ?01/29/2022 ?6:27 AM ?

## 2022-01-29 NOTE — MAU Note (Signed)
Helen Dorsey is a 31 y.o. at [redacted]w[redacted]d here in MAU reporting: Pt reports dark red vaginal bleeding upon waking and using the bathroom. No pain.  ?LMP: unknown ?Onset of complaint: 0545 ?Pain score: 0/10 ?Vitals:  ? 01/29/22 0611  ?BP: (!) 138/94  ?Pulse: (!) 114  ?Resp: 18  ?Temp: 97.8 ?F (36.6 ?C)  ?SpO2: 99%  ?   ?FHT: ?Lab orders placed from triage: urinalysis ? ?

## 2022-02-11 DIAGNOSIS — N898 Other specified noninflammatory disorders of vagina: Secondary | ICD-10-CM | POA: Diagnosis not present

## 2022-02-19 DIAGNOSIS — O99212 Obesity complicating pregnancy, second trimester: Secondary | ICD-10-CM | POA: Diagnosis not present

## 2022-02-19 DIAGNOSIS — O24112 Pre-existing diabetes mellitus, type 2, in pregnancy, second trimester: Secondary | ICD-10-CM | POA: Diagnosis not present

## 2022-02-19 DIAGNOSIS — Z36 Encounter for antenatal screening for chromosomal anomalies: Secondary | ICD-10-CM | POA: Diagnosis not present

## 2022-02-21 ENCOUNTER — Other Ambulatory Visit: Payer: Self-pay | Admitting: Obstetrics and Gynecology

## 2022-02-21 DIAGNOSIS — Z363 Encounter for antenatal screening for malformations: Secondary | ICD-10-CM

## 2022-02-22 ENCOUNTER — Inpatient Hospital Stay (HOSPITAL_COMMUNITY)
Admission: AD | Admit: 2022-02-22 | Discharge: 2022-02-22 | Disposition: A | Discharge status: Home / Self Care | Payer: Medicaid Other | Attending: Obstetrics and Gynecology | Admitting: Obstetrics and Gynecology

## 2022-02-22 ENCOUNTER — Encounter (HOSPITAL_COMMUNITY): Payer: Self-pay | Admitting: Obstetrics and Gynecology

## 2022-02-22 ENCOUNTER — Inpatient Hospital Stay (HOSPITAL_BASED_OUTPATIENT_CLINIC_OR_DEPARTMENT_OTHER): Payer: Medicaid Other

## 2022-02-22 DIAGNOSIS — O43102 Malformation of placenta, unspecified, second trimester: Secondary | ICD-10-CM | POA: Diagnosis not present

## 2022-02-22 DIAGNOSIS — O4402 Placenta previa specified as without hemorrhage, second trimester: Secondary | ICD-10-CM

## 2022-02-22 DIAGNOSIS — O24112 Pre-existing diabetes mellitus, type 2, in pregnancy, second trimester: Secondary | ICD-10-CM | POA: Insufficient documentation

## 2022-02-22 DIAGNOSIS — O99212 Obesity complicating pregnancy, second trimester: Secondary | ICD-10-CM | POA: Diagnosis not present

## 2022-02-22 DIAGNOSIS — Z3A19 19 weeks gestation of pregnancy: Secondary | ICD-10-CM

## 2022-02-22 DIAGNOSIS — Z3686 Encounter for antenatal screening for cervical length: Secondary | ICD-10-CM

## 2022-02-22 DIAGNOSIS — O4412 Placenta previa with hemorrhage, second trimester: Secondary | ICD-10-CM | POA: Insufficient documentation

## 2022-02-22 DIAGNOSIS — O4692 Antepartum hemorrhage, unspecified, second trimester: Secondary | ICD-10-CM | POA: Diagnosis not present

## 2022-02-22 DIAGNOSIS — O209 Hemorrhage in early pregnancy, unspecified: Secondary | ICD-10-CM

## 2022-02-22 LAB — URINALYSIS, ROUTINE W REFLEX MICROSCOPIC
Bilirubin Urine: NEGATIVE
Glucose, UA: NEGATIVE mg/dL
Ketones, ur: NEGATIVE mg/dL
Leukocytes,Ua: NEGATIVE
Nitrite: NEGATIVE
Protein, ur: NEGATIVE mg/dL
Specific Gravity, Urine: 1.023 (ref 1.005–1.030)
pH: 6 (ref 5.0–8.0)

## 2022-02-22 NOTE — MAU Provider Note (Signed)
?History  ?  ? ?CSN: [id number] ? ?Arrival date and time: 02/22/22 1035 ? ? Event Date/Time  ? First Provider Initiated Contact with Patient 02/22/22 1103   ?  ? ?Chief Complaint  ?Patient presents with  ? Vaginal Bleeding  ? ?HPI ?Helen Dorsey is a 31 y.o. G1P0 at [redacted]w[redacted]d who presents with vaginal bleeding. She reports she was in the office last week and had an ultrasound and was told she no longer had a previa. She reports Thursday she had an episode of heavy bleeding but was not seen for this. She reports it stopped and then restarted this morning. She reports today it is more than spotting but less than a period. She denies any pain. She denies any recent intercourse.  ? ?OB History   ? ? Gravida  ?1  ? Para  ?   ? Term  ?   ? Preterm  ?   ? AB  ?   ? Living  ?   ?  ? ? SAB  ?   ? IAB  ?   ? Ectopic  ?   ? Multiple  ?   ? Live Births  ?   ?   ?  ?  ? ? ?Past Medical History:  ?Diagnosis Date  ? Diabetes mellitus without complication ([facility])   ? ? ?Past Surgical History:  ?Procedure Laterality Date  ? NO PAST SURGERIES    ? ? ?History reviewed. No pertinent family history. ? ?Social History  ? ?Tobacco Use  ? Smoking status: Every Day  ? Smokeless tobacco: Never  ?Vaping Use  ? Vaping Use: Never used  ?Substance Use Topics  ? Alcohol use: Yes  ?  Comment: occasionally  ? Drug use: No  ? ? ?Allergies: No Known Allergies ? ?Medications Prior to Admission  ?Medication Sig Dispense Refill Last Dose  ? metFORMIN (GLUCOPHAGE) 500 MG tablet Take by mouth 2 (two) times daily with a meal.   02/21/2022  ? Prenatal Vit-Fe Fumarate-FA (PRENATAL MULTIVITAMIN) TABS tablet Take 1 tablet by mouth daily at 12 noon.   02/21/2022  ? atorvastatin (LIPITOR) 10 MG tablet Take 1 tablet (10 mg total) by mouth daily. 90 tablet 3   ? buPROPion (WELLBUTRIN SR) 150 MG 12 hr tablet Take 1 tablet (150 mg total) by mouth 2 (two) times daily. Take 1 pill daily for 3 days then take 1 pill twice daily 180 tablet 1   ? insulin aspart (NOVOLOG)  100 UNIT/ML FlexPen For blood sugars 0-150 give 0 units of insulin, -200 -250  Give 4 units of insulin,251-300  give 6 units, 301-350 give 8 units,  351 -400 give 10  .  401 > 12 units and call M.D. ?Discussed hypoglycemia protocol. (Patient not taking: Reported on 03/13/2021) 15 mL 1   ? Insulin Glargine (BASAGLAR KWIKPEN) 100 UNIT/ML Inject 10 Units into the skin daily. 3 mL 6   ? Insulin Pen Needle (PEN NEEDLES) 33G X 4 MM MISC 1 each by Does not apply route in the morning, at noon, in the evening, and at bedtime. 100 each 0   ? norethindrone (AYGESTIN) 5 MG tablet Take 5 mg by mouth daily.     ? omeprazole (PRILOSEC) 40 MG capsule Take 1 capsule (40 mg total) by mouth daily. (Patient not taking: Reported on 03/13/2021) 30 capsule 2   ? ? ?Review of Systems  ?Constitutional: Negative.  Negative for fatigue and fever.  ?HENT: Negative.    ?Respiratory: Negative.  Negative for shortness of breath.   ?Cardiovascular: Negative.  Negative for chest pain.  ?Gastrointestinal: Negative.  Negative for abdominal pain, constipation, diarrhea, nausea and vomiting.  ?Genitourinary:  Positive for vaginal bleeding. Negative for dysuria and vaginal discharge.  ?Neurological: Negative.  Negative for dizziness and headaches.  ?Physical Exam  ? ?Blood pressure 123/85, pulse (!) 116, temperature 97.9 ?F (36.6 ?C), temperature source Oral, resp. rate 16, weight 104.8 kg, last menstrual period 02/23/2021. ? ?Physical Exam ?Vitals and nursing note reviewed.  ?Constitutional:   ?   General: She is not in acute distress. ?   Appearance: She is well-developed.  ?HENT:  ?   Head: Normocephalic.  ?Eyes:  ?   Pupils: Pupils are equal, round, and reactive to light.  ?Cardiovascular:  ?   Rate and Rhythm: Normal rate and regular rhythm.  ?   Heart sounds: Normal heart sounds.  ?Pulmonary:  ?   Effort: Pulmonary effort is normal. No respiratory distress.  ?   Breath sounds: Normal breath sounds.  ?Abdominal:  ?   General: Bowel sounds are normal.  There is no distension.  ?   Palpations: Abdomen is soft.  ?   Tenderness: There is no abdominal tenderness.  ?Genitourinary: ?   Comments: Small amount of dark red blood in vault ?Skin: ?   General: Skin is warm and dry.  ?Neurological:  ?   Mental Status: She is alert and oriented to person, place, and time.  ?Psychiatric:     ?   Mood and Affect: Mood normal.     ?   Behavior: Behavior normal.     ?   Thought Content: Thought content normal.     ?   Judgment: Judgment normal.  ? ?FHT: 138 bpm ? ?MAU Course  ?Procedures ?Results for orders placed or performed during the hospital encounter of 02/22/22 (from the past 24 hour(s))  ?Urinalysis, Routine w reflex microscopic Urine, Clean Catch     Status: Abnormal  ? Collection Time: 02/22/22 11:01 AM  ?Result Value Ref Range  ? Color, Urine YELLOW YELLOW  ? APPearance HAZY (A) CLEAR  ? Specific Gravity, Urine 1.023 1.005 - 1.030  ? pH 6.0 5.0 - 8.0  ? Glucose, UA NEGATIVE NEGATIVE mg/dL  ? Hgb urine dipstick LARGE (A) NEGATIVE  ? Bilirubin Urine NEGATIVE NEGATIVE  ? Ketones, ur NEGATIVE NEGATIVE mg/dL  ? Protein, ur NEGATIVE NEGATIVE mg/dL  ? Nitrite NEGATIVE NEGATIVE  ? Leukocytes,Ua NEGATIVE NEGATIVE  ? RBC / HPF 0-5 0 - 5 RBC/hpf  ? WBC, UA 0-5 0 - 5 WBC/hpf  ? Bacteria, UA RARE (A) NONE SEEN  ? Squamous Epithelial / LPF 6-10 0 - 5  ? Mucus PRESENT   ?  ?MDM ?UA ?[place] MFM OB Limited- cervical length 3.5cm and previa still seen ? ?Assessment and Plan  ? ?1. Placenta previa in second trimester   ?2. [redacted] weeks gestation of pregnancy   ?3. Vaginal bleeding affecting early pregnancy   ? ?-Discharge home in stable condition ?-Vaginal bleeding precautions discussed ?-Patient advised to follow-up with OB this week to follow up on bleeding. ?-Patient may return to MAU as needed or if her condition were to change or worsen ? ? ?Rolm Bookbinder CNM ?02/22/2022, 11:03 AM  ?

## 2022-02-22 NOTE — MAU Note (Signed)
.  Helen Dorsey is a 31 y.o. at [redacted]w[redacted]d here in MAU reporting: noticed bright red bleeding " a little heavier than spotting" when she wiped around 0945. States she also had an episode of bleeding on Thursday as well but was not seen for it. No recent intercourse. Denies pain. Has posterior placenta previa.  ? ?Pain score: 0 ?Vitals:  ? 02/22/22 1043  ?BP: 123/85  ?Pulse: (!) 116  ?Resp: 16  ?Temp: 97.9 ?F (36.6 ?C)  ?   ? ?Lab orders placed from triage:  UA ?

## 2022-02-22 NOTE — Discharge Instructions (Signed)

## 2022-03-18 ENCOUNTER — Ambulatory Visit: Payer: BC Managed Care – PPO | Admitting: *Deleted

## 2022-03-18 ENCOUNTER — Ambulatory Visit (HOSPITAL_BASED_OUTPATIENT_CLINIC_OR_DEPARTMENT_OTHER): Payer: BC Managed Care – PPO | Admitting: Obstetrics

## 2022-03-18 ENCOUNTER — Encounter: Payer: Self-pay | Admitting: *Deleted

## 2022-03-18 ENCOUNTER — Other Ambulatory Visit: Payer: Self-pay | Admitting: *Deleted

## 2022-03-18 ENCOUNTER — Ambulatory Visit: Payer: BC Managed Care – PPO | Attending: Obstetrics and Gynecology

## 2022-03-18 ENCOUNTER — Ambulatory Visit: Payer: BC Managed Care – PPO

## 2022-03-18 VITALS — BP 127/88 | HR 120

## 2022-03-18 DIAGNOSIS — O4402 Placenta previa specified as without hemorrhage, second trimester: Secondary | ICD-10-CM | POA: Insufficient documentation

## 2022-03-18 DIAGNOSIS — Z6841 Body Mass Index (BMI) 40.0 and over, adult: Secondary | ICD-10-CM

## 2022-03-18 DIAGNOSIS — O24112 Pre-existing diabetes mellitus, type 2, in pregnancy, second trimester: Secondary | ICD-10-CM

## 2022-03-18 DIAGNOSIS — Z3A23 23 weeks gestation of pregnancy: Secondary | ICD-10-CM | POA: Insufficient documentation

## 2022-03-18 DIAGNOSIS — O99212 Obesity complicating pregnancy, second trimester: Secondary | ICD-10-CM | POA: Insufficient documentation

## 2022-03-18 DIAGNOSIS — Z363 Encounter for antenatal screening for malformations: Secondary | ICD-10-CM | POA: Diagnosis not present

## 2022-03-18 NOTE — Progress Notes (Signed)
MFM Notes  Helen Dorsey was seen for a detailed fetal anatomy scan due to maternal obesity with a BMI of 42 and pregestational diabetes treated with metformin 500 mg 4 times a day.  She was diagnosed with type 2 diabetes about a year and a half ago.  Her most recent hemoglobin A1c was 6.5%.  A placenta previa was noted earlier in her pregnancy.  She has not had a screening test for fetal aneuploidy drawn in her current pregnancy.  She was informed that the fetal growth and amniotic fluid level were appropriate for her gestational age.   There were no obvious fetal anomalies noted on today's ultrasound exam.  The patient was informed that anomalies may be missed due to technical limitations. If the fetus is in a suboptimal position or maternal habitus is increased, visualization of the fetus in the maternal uterus may be impaired.  The following were discussed during today's consultation:  Pregestational diabetes and pregnancy  The implications and management of diabetes in pregnancy was discussed in detail with the patient.    She was advised to continue to monitor her fingersticks 4 times daily (fasting and 2 hours after each meal).    She was advised that our goals for her fingerstick values are fasting values of 90-95 or less and two-hour postprandial values of 120 or less.    Should the majority of her fingerstick results be above these values despite treatment with metformin,  she may have to be switched to insulin to help her achieve better glycemic control.  The patient was advised that getting her fingerstick values as close to these goals as possible would provide her with the most optimal obstetrical outcome.  Once to twice weekly fetal testing using either nonstress tests or biophysical profiles should be started at around 32 weeks.  The increased risk of polyhydramnios, fetal macrosomia, and preeclampsia associated with diabetes was also discussed.    The patient  was advised that delivery for well-controlled diabetes in pregnancy is usually recommended at around 39 weeks.  Delivery at 37 weeks may be considered should her glycemic control be poor.  As women with diabetes may be at increased risk for developing preeclampsia, she was advised to continue taking a daily baby aspirin for preeclampsia prophylaxis.  Obesity in pregnancy  The recommended total weight gain in pregnancy for obese women's between 10 to 20 pounds.  As maternal obesity may present challenges associated with the management of anesthesia, an anesthesia consult should be obtained when she is admitted in labor.  As the patient has not had a screening test for fetal aneuploidy drawn, she had the Natera Panorama cell free DNA test drawn following today's ultrasound exam.  Our genetic counselor will notify the patient regarding the results of this test.  A low-lying placenta was noted on today's exam.  The patient was reassured that the placenta will most likely move away from the lower uterine segment later in her pregnancy.  We will assess the placental location during her future ultrasound exams.  The patient stated that all of her questions have been answered to her satisfaction.    A follow-up exam was scheduled in 4 weeks.  A total of 45 minutes was spent counseling and coordinating the care for this patient.  Greater than 50% of the time was spent in direct face-to-face contact.

## 2022-03-26 ENCOUNTER — Telehealth: Payer: Self-pay | Admitting: Genetics

## 2022-03-26 NOTE — Telephone Encounter (Signed)
Helen Dorsey was contacted by genetic counseling intern via telephone to review their noninvasive prenatal screening (NIPS) result. The result is low risk, consistent with a female fetus. This screening significantly reduces the risk that the current pregnancy has Down syndrome, Trisomy 25, Trisomy 13, Monosomy X, and Triploidy. Helen Dorsey understands that this is a screening and not a diagnostic test. All questions answered. Certified Dentist present for phone call.

## 2022-03-28 DIAGNOSIS — O24912 Unspecified diabetes mellitus in pregnancy, second trimester: Secondary | ICD-10-CM | POA: Diagnosis not present

## 2022-03-29 IMAGING — DX DG WRIST COMPLETE 3+V*L*
4 series · 4 of 4 positions shown · non-contrast
Comparison: None

CLINICAL DATA: LEFT wrist pain and swelling for 1 day, no known
injury. No relief with ibuprofen

EXAM:
LEFT WRIST - COMPLETE 3+ VIEW

[wrist pa]
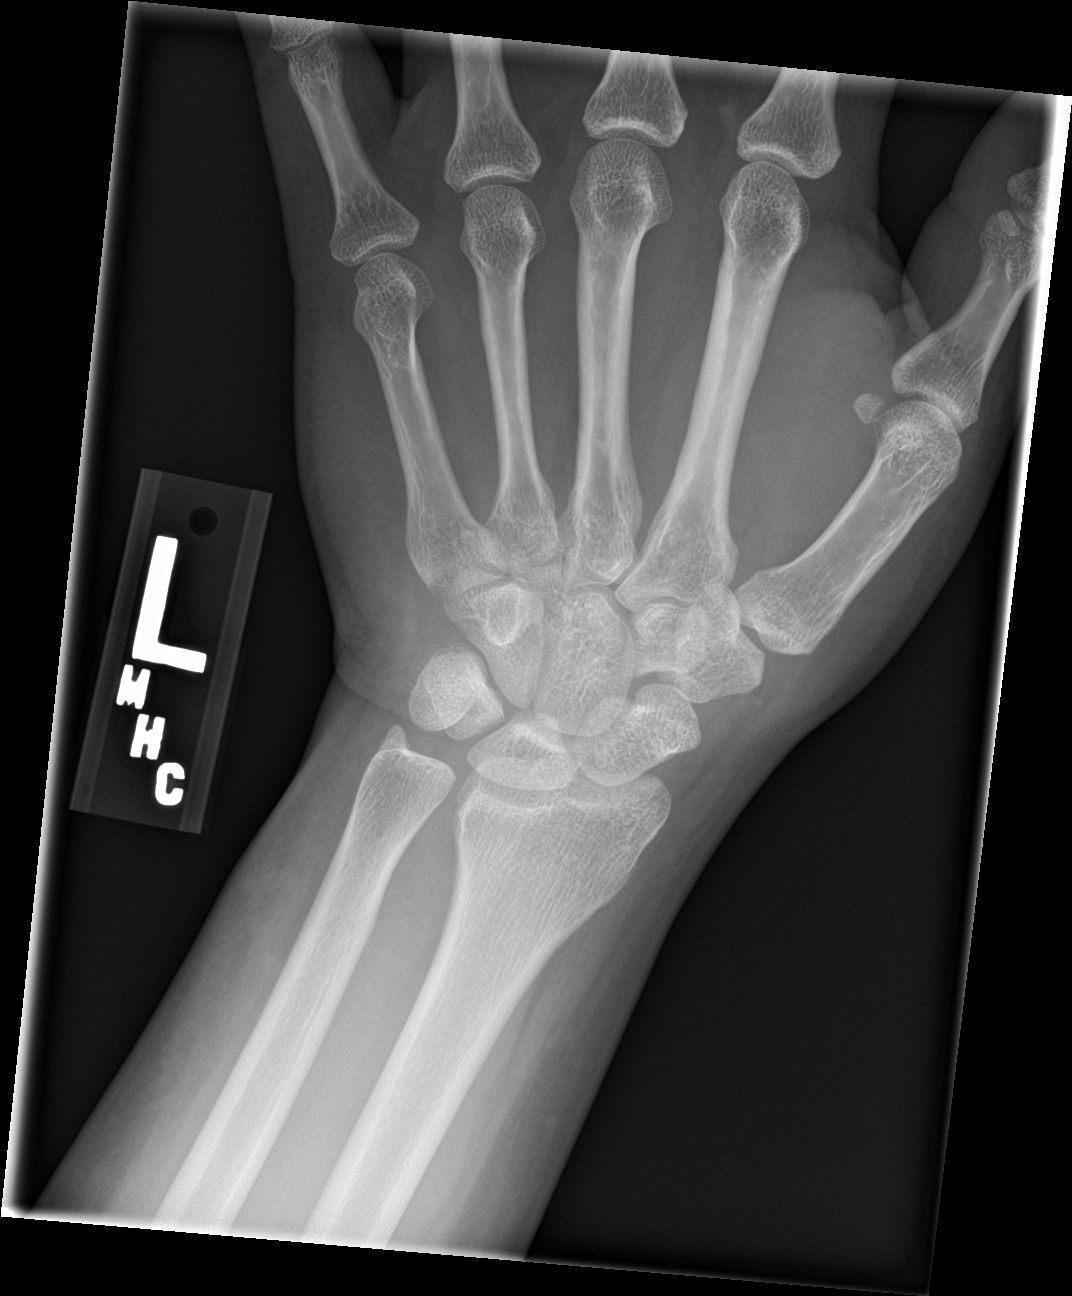

[wrist navicular]
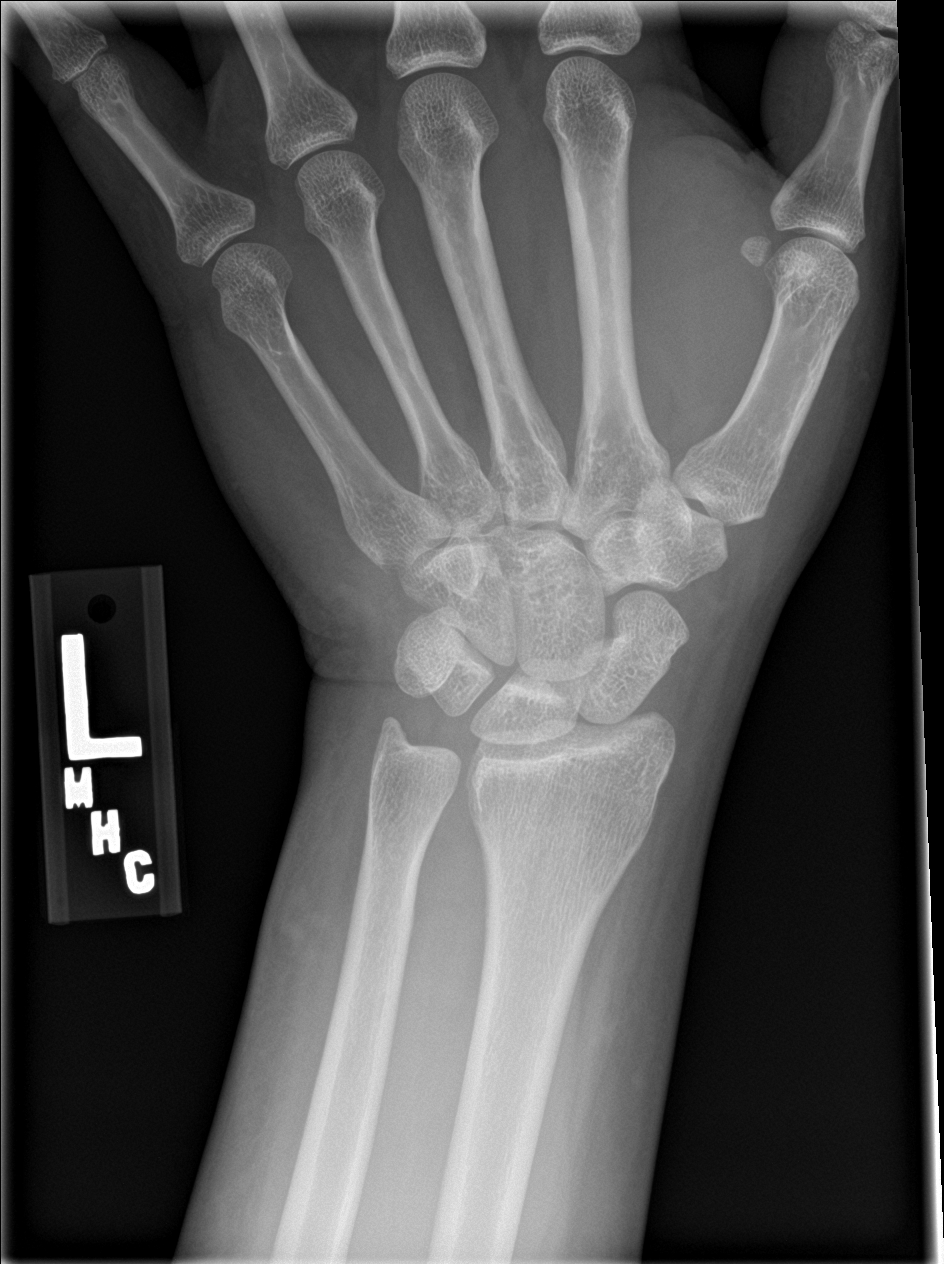

[wrist obl]
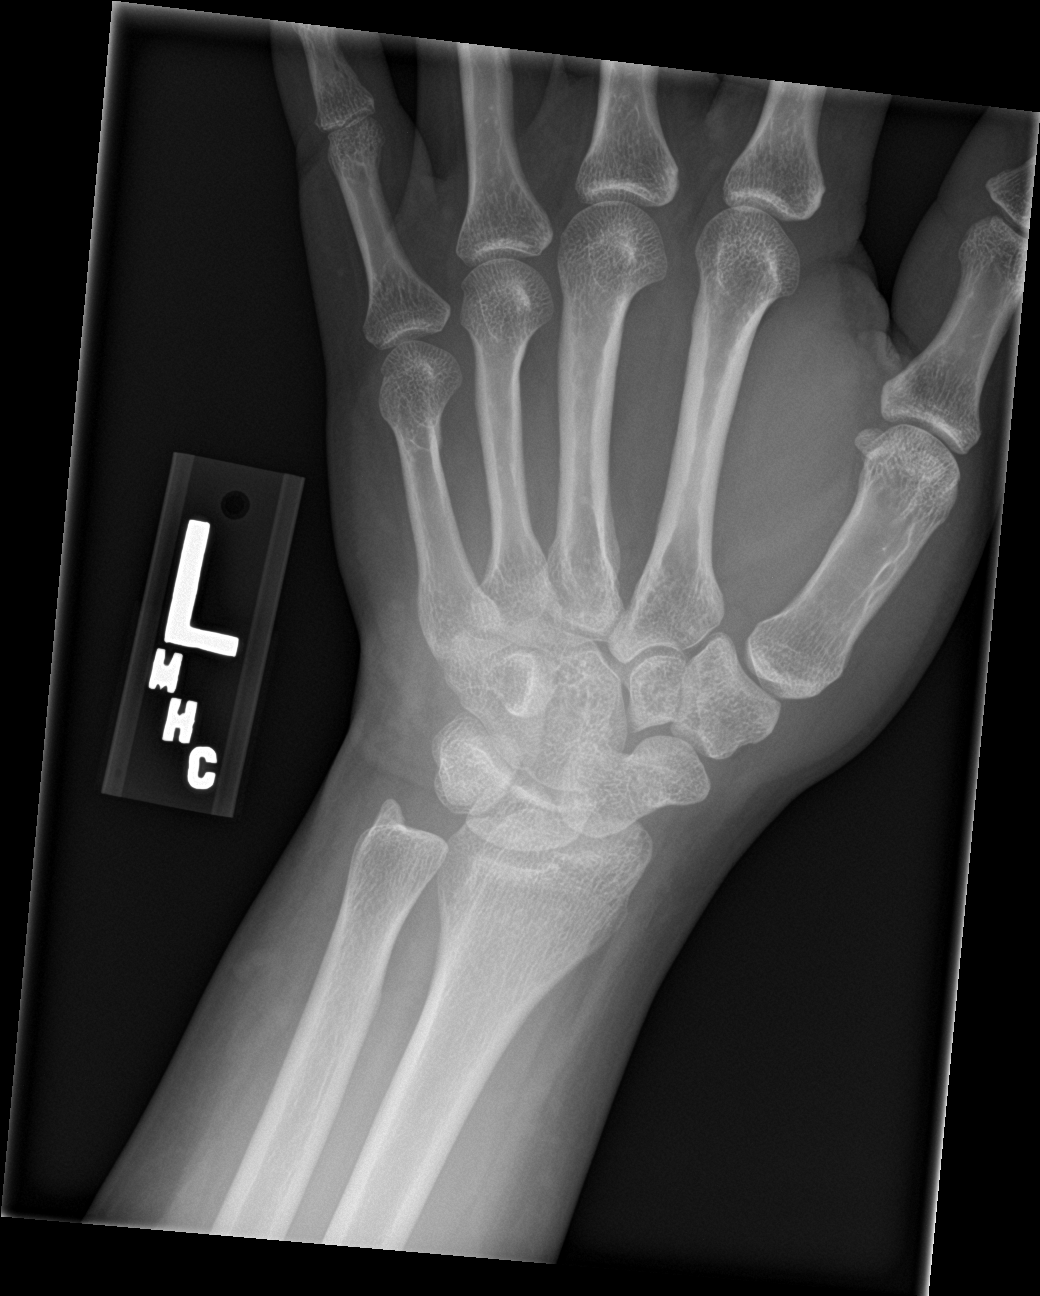

[wrist lat]
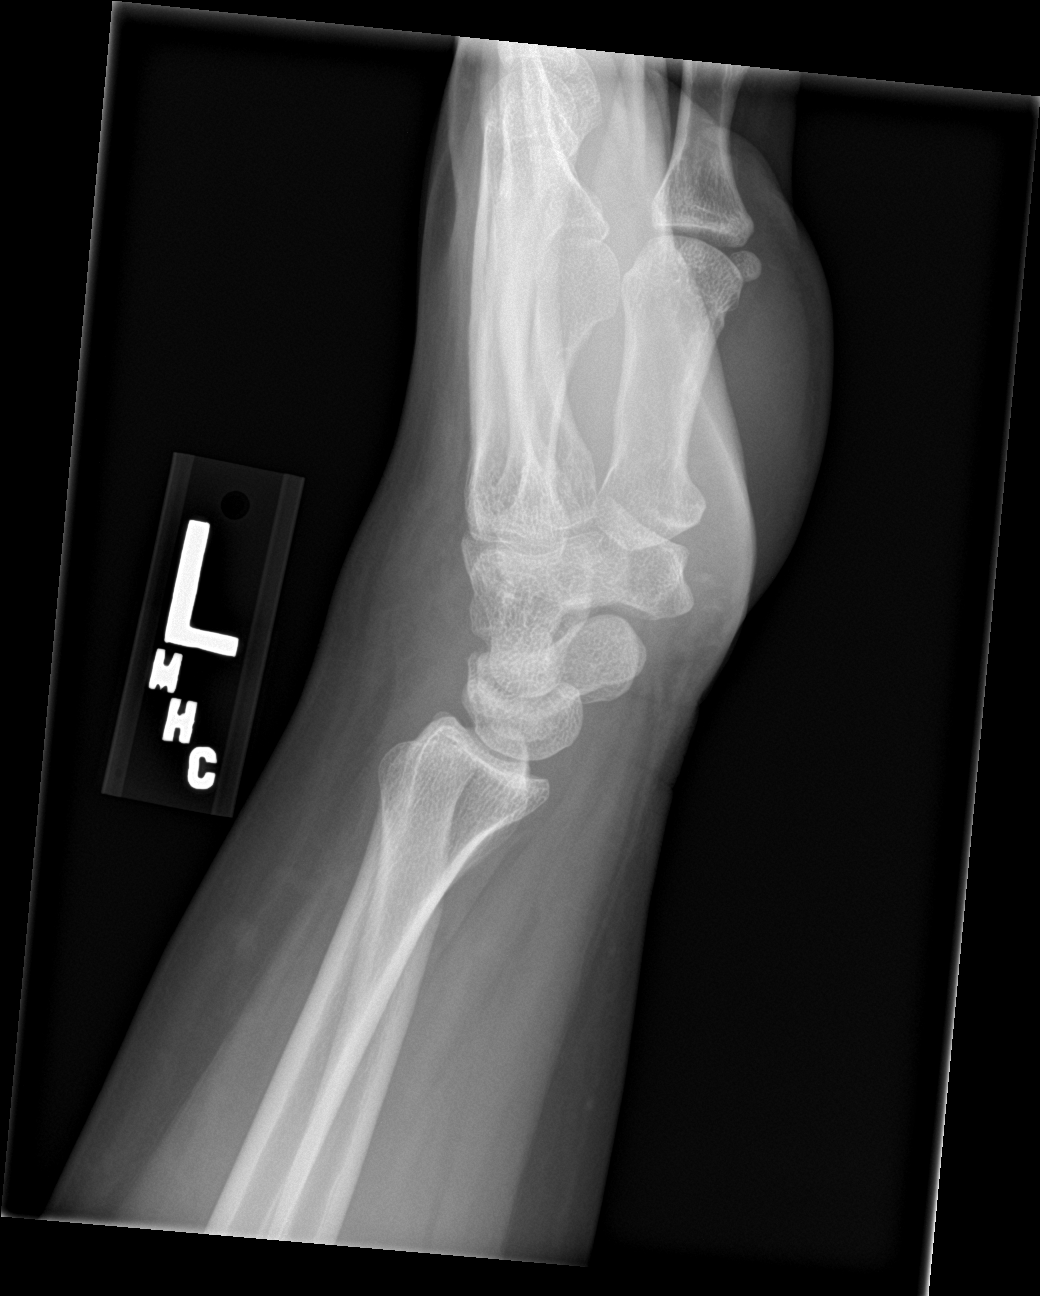

[4 of 4 positions shown; findings below may reference images not displayed]

FINDINGS: Osseous mineralization normal.

Joint spaces preserved.

Dorsal soft tissue swelling overlying the carpus.

No acute fracture, dislocation, or bone destruction.

No erosive changes seen.
IMPRESSION: Dorsal soft tissue swelling without osseous abnormality.

## 2022-04-17 ENCOUNTER — Ambulatory Visit: Payer: BC Managed Care – PPO | Attending: Obstetrics

## 2022-04-17 ENCOUNTER — Other Ambulatory Visit: Payer: Self-pay

## 2022-04-17 ENCOUNTER — Other Ambulatory Visit: Payer: Self-pay | Admitting: Obstetrics

## 2022-04-17 ENCOUNTER — Ambulatory Visit: Payer: BC Managed Care – PPO | Admitting: *Deleted

## 2022-04-17 VITALS — BP 119/85 | HR 124

## 2022-04-17 DIAGNOSIS — E669 Obesity, unspecified: Secondary | ICD-10-CM

## 2022-04-17 DIAGNOSIS — O99212 Obesity complicating pregnancy, second trimester: Secondary | ICD-10-CM | POA: Insufficient documentation

## 2022-04-17 DIAGNOSIS — O365920 Maternal care for other known or suspected poor fetal growth, second trimester, not applicable or unspecified: Secondary | ICD-10-CM | POA: Insufficient documentation

## 2022-04-17 DIAGNOSIS — O24113 Pre-existing diabetes mellitus, type 2, in pregnancy, third trimester: Secondary | ICD-10-CM | POA: Insufficient documentation

## 2022-04-17 DIAGNOSIS — Z3A27 27 weeks gestation of pregnancy: Secondary | ICD-10-CM

## 2022-04-17 DIAGNOSIS — O24112 Pre-existing diabetes mellitus, type 2, in pregnancy, second trimester: Secondary | ICD-10-CM | POA: Diagnosis not present

## 2022-04-17 DIAGNOSIS — O4442 Low lying placenta NOS or without hemorrhage, second trimester: Secondary | ICD-10-CM

## 2022-04-18 ENCOUNTER — Other Ambulatory Visit: Payer: Self-pay | Admitting: *Deleted

## 2022-04-18 DIAGNOSIS — O365920 Maternal care for other known or suspected poor fetal growth, second trimester, not applicable or unspecified: Secondary | ICD-10-CM

## 2022-04-18 DIAGNOSIS — O99212 Obesity complicating pregnancy, second trimester: Secondary | ICD-10-CM

## 2022-04-18 DIAGNOSIS — O24112 Pre-existing diabetes mellitus, type 2, in pregnancy, second trimester: Secondary | ICD-10-CM

## 2022-05-05 ENCOUNTER — Ambulatory Visit: Payer: BC Managed Care – PPO | Attending: Obstetrics

## 2022-05-05 ENCOUNTER — Ambulatory Visit: Payer: BC Managed Care – PPO | Admitting: *Deleted

## 2022-05-05 ENCOUNTER — Other Ambulatory Visit: Payer: Self-pay | Admitting: Obstetrics and Gynecology

## 2022-05-05 ENCOUNTER — Ambulatory Visit: Payer: BC Managed Care – PPO

## 2022-05-05 ENCOUNTER — Encounter: Payer: Self-pay | Admitting: *Deleted

## 2022-05-05 VITALS — BP 130/91 | HR 120

## 2022-05-05 DIAGNOSIS — O24112 Pre-existing diabetes mellitus, type 2, in pregnancy, second trimester: Secondary | ICD-10-CM | POA: Diagnosis not present

## 2022-05-05 DIAGNOSIS — O99213 Obesity complicating pregnancy, third trimester: Secondary | ICD-10-CM | POA: Diagnosis not present

## 2022-05-05 DIAGNOSIS — O365930 Maternal care for other known or suspected poor fetal growth, third trimester, not applicable or unspecified: Secondary | ICD-10-CM | POA: Diagnosis not present

## 2022-05-05 DIAGNOSIS — O99212 Obesity complicating pregnancy, second trimester: Secondary | ICD-10-CM | POA: Insufficient documentation

## 2022-05-05 DIAGNOSIS — Z364 Encounter for antenatal screening for fetal growth retardation: Secondary | ICD-10-CM

## 2022-05-05 DIAGNOSIS — O24113 Pre-existing diabetes mellitus, type 2, in pregnancy, third trimester: Secondary | ICD-10-CM | POA: Diagnosis not present

## 2022-05-05 DIAGNOSIS — E119 Type 2 diabetes mellitus without complications: Secondary | ICD-10-CM

## 2022-05-05 DIAGNOSIS — O365920 Maternal care for other known or suspected poor fetal growth, second trimester, not applicable or unspecified: Secondary | ICD-10-CM | POA: Insufficient documentation

## 2022-05-05 DIAGNOSIS — Z3A29 29 weeks gestation of pregnancy: Secondary | ICD-10-CM

## 2022-05-05 DIAGNOSIS — E669 Obesity, unspecified: Secondary | ICD-10-CM

## 2022-05-12 ENCOUNTER — Ambulatory Visit: Payer: BC Managed Care – PPO

## 2022-05-12 ENCOUNTER — Ambulatory Visit: Payer: BC Managed Care – PPO | Admitting: *Deleted

## 2022-05-12 ENCOUNTER — Encounter: Payer: Self-pay | Admitting: *Deleted

## 2022-05-12 ENCOUNTER — Ambulatory Visit (HOSPITAL_BASED_OUTPATIENT_CLINIC_OR_DEPARTMENT_OTHER): Payer: BC Managed Care – PPO | Admitting: *Deleted

## 2022-05-12 ENCOUNTER — Ambulatory Visit: Payer: BC Managed Care – PPO | Attending: Obstetrics

## 2022-05-12 DIAGNOSIS — Z794 Long term (current) use of insulin: Secondary | ICD-10-CM

## 2022-05-12 DIAGNOSIS — O99213 Obesity complicating pregnancy, third trimester: Secondary | ICD-10-CM

## 2022-05-12 DIAGNOSIS — O365920 Maternal care for other known or suspected poor fetal growth, second trimester, not applicable or unspecified: Secondary | ICD-10-CM | POA: Diagnosis not present

## 2022-05-12 DIAGNOSIS — O99212 Obesity complicating pregnancy, second trimester: Secondary | ICD-10-CM | POA: Diagnosis not present

## 2022-05-12 DIAGNOSIS — O24112 Pre-existing diabetes mellitus, type 2, in pregnancy, second trimester: Secondary | ICD-10-CM

## 2022-05-12 DIAGNOSIS — O24113 Pre-existing diabetes mellitus, type 2, in pregnancy, third trimester: Secondary | ICD-10-CM

## 2022-05-12 DIAGNOSIS — O365930 Maternal care for other known or suspected poor fetal growth, third trimester, not applicable or unspecified: Secondary | ICD-10-CM

## 2022-05-12 DIAGNOSIS — Z3A30 30 weeks gestation of pregnancy: Secondary | ICD-10-CM | POA: Insufficient documentation

## 2022-05-12 DIAGNOSIS — E119 Type 2 diabetes mellitus without complications: Secondary | ICD-10-CM

## 2022-05-12 DIAGNOSIS — E669 Obesity, unspecified: Secondary | ICD-10-CM

## 2022-05-12 NOTE — Procedures (Signed)
Helen Dorsey 1991-05-22 [redacted]w[redacted]d  Fetus A Non-Stress Test Interpretation for 05/12/22  Indication: IUGR  Fetal Heart Rate A Mode: External Baseline Rate (A): 150 bpm Variability: Minimal, Moderate Accelerations: 10 x 10 Decelerations: None Multiple birth?: No  Uterine Activity Mode: Palpation, Toco Contraction Frequency (min): none Resting Tone Palpated: Relaxed  Interpretation (Fetal Testing) Nonstress Test Interpretation: Reactive Overall Impression: Reassuring for gestational age Comments: Dr. Judeth Cornfield reviewed tracing

## 2022-05-13 ENCOUNTER — Other Ambulatory Visit: Payer: Self-pay | Admitting: *Deleted

## 2022-05-13 DIAGNOSIS — O24113 Pre-existing diabetes mellitus, type 2, in pregnancy, third trimester: Secondary | ICD-10-CM

## 2022-05-13 DIAGNOSIS — Z6841 Body Mass Index (BMI) 40.0 and over, adult: Secondary | ICD-10-CM

## 2022-05-19 ENCOUNTER — Ambulatory Visit: Payer: BC Managed Care – PPO | Admitting: *Deleted

## 2022-05-19 ENCOUNTER — Encounter: Payer: Self-pay | Admitting: *Deleted

## 2022-05-19 ENCOUNTER — Ambulatory Visit: Payer: BC Managed Care – PPO | Attending: Obstetrics

## 2022-05-19 DIAGNOSIS — O24113 Pre-existing diabetes mellitus, type 2, in pregnancy, third trimester: Secondary | ICD-10-CM

## 2022-05-19 DIAGNOSIS — Z3A31 31 weeks gestation of pregnancy: Secondary | ICD-10-CM

## 2022-05-19 DIAGNOSIS — Z794 Long term (current) use of insulin: Secondary | ICD-10-CM

## 2022-05-19 DIAGNOSIS — O365930 Maternal care for other known or suspected poor fetal growth, third trimester, not applicable or unspecified: Secondary | ICD-10-CM

## 2022-05-19 DIAGNOSIS — O365920 Maternal care for other known or suspected poor fetal growth, second trimester, not applicable or unspecified: Secondary | ICD-10-CM | POA: Insufficient documentation

## 2022-05-19 DIAGNOSIS — O99213 Obesity complicating pregnancy, third trimester: Secondary | ICD-10-CM | POA: Diagnosis not present

## 2022-05-19 DIAGNOSIS — E119 Type 2 diabetes mellitus without complications: Secondary | ICD-10-CM | POA: Diagnosis not present

## 2022-05-19 DIAGNOSIS — O24112 Pre-existing diabetes mellitus, type 2, in pregnancy, second trimester: Secondary | ICD-10-CM | POA: Insufficient documentation

## 2022-05-19 DIAGNOSIS — O99212 Obesity complicating pregnancy, second trimester: Secondary | ICD-10-CM | POA: Insufficient documentation

## 2022-05-19 DIAGNOSIS — E669 Obesity, unspecified: Secondary | ICD-10-CM

## 2022-05-27 DIAGNOSIS — O10913 Unspecified pre-existing hypertension complicating pregnancy, third trimester: Secondary | ICD-10-CM | POA: Diagnosis not present

## 2022-05-27 DIAGNOSIS — O10919 Unspecified pre-existing hypertension complicating pregnancy, unspecified trimester: Secondary | ICD-10-CM | POA: Diagnosis not present

## 2022-05-28 ENCOUNTER — Ambulatory Visit: Payer: BC Managed Care – PPO | Attending: Obstetrics and Gynecology

## 2022-05-28 ENCOUNTER — Other Ambulatory Visit: Payer: Self-pay | Admitting: *Deleted

## 2022-05-28 ENCOUNTER — Other Ambulatory Visit: Payer: Self-pay | Admitting: Obstetrics and Gynecology

## 2022-05-28 ENCOUNTER — Ambulatory Visit: Payer: BC Managed Care – PPO | Admitting: *Deleted

## 2022-05-28 VITALS — BP 129/86 | HR 115

## 2022-05-28 DIAGNOSIS — O99213 Obesity complicating pregnancy, third trimester: Secondary | ICD-10-CM

## 2022-05-28 DIAGNOSIS — O24113 Pre-existing diabetes mellitus, type 2, in pregnancy, third trimester: Secondary | ICD-10-CM

## 2022-05-28 DIAGNOSIS — O365930 Maternal care for other known or suspected poor fetal growth, third trimester, not applicable or unspecified: Secondary | ICD-10-CM | POA: Diagnosis not present

## 2022-05-28 DIAGNOSIS — E119 Type 2 diabetes mellitus without complications: Secondary | ICD-10-CM | POA: Diagnosis not present

## 2022-05-28 DIAGNOSIS — Z3A33 33 weeks gestation of pregnancy: Secondary | ICD-10-CM | POA: Insufficient documentation

## 2022-05-28 DIAGNOSIS — E669 Obesity, unspecified: Secondary | ICD-10-CM | POA: Insufficient documentation

## 2022-05-28 DIAGNOSIS — Z794 Long term (current) use of insulin: Secondary | ICD-10-CM

## 2022-05-28 DIAGNOSIS — Z6841 Body Mass Index (BMI) 40.0 and over, adult: Secondary | ICD-10-CM

## 2022-06-02 ENCOUNTER — Other Ambulatory Visit: Payer: Self-pay | Admitting: *Deleted

## 2022-06-02 DIAGNOSIS — Z6841 Body Mass Index (BMI) 40.0 and over, adult: Secondary | ICD-10-CM

## 2022-06-02 DIAGNOSIS — O24113 Pre-existing diabetes mellitus, type 2, in pregnancy, third trimester: Secondary | ICD-10-CM

## 2022-06-03 ENCOUNTER — Ambulatory Visit: Payer: BC Managed Care – PPO | Admitting: *Deleted

## 2022-06-03 ENCOUNTER — Ambulatory Visit: Payer: BC Managed Care – PPO | Attending: Obstetrics and Gynecology

## 2022-06-03 ENCOUNTER — Encounter (HOSPITAL_COMMUNITY): Payer: Self-pay | Admitting: Obstetrics and Gynecology

## 2022-06-03 ENCOUNTER — Inpatient Hospital Stay (HOSPITAL_BASED_OUTPATIENT_CLINIC_OR_DEPARTMENT_OTHER): Payer: BC Managed Care – PPO | Admitting: Obstetrics

## 2022-06-03 ENCOUNTER — Inpatient Hospital Stay (HOSPITAL_COMMUNITY)
Admission: AD | Admit: 2022-06-03 | Discharge: 2022-06-03 | Disposition: A | Discharge status: Home / Self Care | Payer: BC Managed Care – PPO | Attending: Obstetrics and Gynecology | Admitting: Obstetrics and Gynecology

## 2022-06-03 ENCOUNTER — Other Ambulatory Visit: Payer: Self-pay

## 2022-06-03 VITALS — BP 142/103 | HR 122

## 2022-06-03 DIAGNOSIS — E119 Type 2 diabetes mellitus without complications: Secondary | ICD-10-CM

## 2022-06-03 DIAGNOSIS — Z3689 Encounter for other specified antenatal screening: Secondary | ICD-10-CM | POA: Diagnosis not present

## 2022-06-03 DIAGNOSIS — Z3A34 34 weeks gestation of pregnancy: Secondary | ICD-10-CM | POA: Diagnosis not present

## 2022-06-03 DIAGNOSIS — E669 Obesity, unspecified: Secondary | ICD-10-CM

## 2022-06-03 DIAGNOSIS — O133 Gestational [pregnancy-induced] hypertension without significant proteinuria, third trimester: Secondary | ICD-10-CM | POA: Diagnosis not present

## 2022-06-03 DIAGNOSIS — O24113 Pre-existing diabetes mellitus, type 2, in pregnancy, third trimester: Secondary | ICD-10-CM | POA: Diagnosis not present

## 2022-06-03 DIAGNOSIS — Z6841 Body Mass Index (BMI) 40.0 and over, adult: Secondary | ICD-10-CM

## 2022-06-03 DIAGNOSIS — O365930 Maternal care for other known or suspected poor fetal growth, third trimester, not applicable or unspecified: Secondary | ICD-10-CM

## 2022-06-03 DIAGNOSIS — O99213 Obesity complicating pregnancy, third trimester: Secondary | ICD-10-CM

## 2022-06-03 DIAGNOSIS — E118 Type 2 diabetes mellitus with unspecified complications: Secondary | ICD-10-CM | POA: Diagnosis not present

## 2022-06-03 DIAGNOSIS — Z794 Long term (current) use of insulin: Secondary | ICD-10-CM | POA: Insufficient documentation

## 2022-06-03 DIAGNOSIS — Z7984 Long term (current) use of oral hypoglycemic drugs: Secondary | ICD-10-CM

## 2022-06-03 DIAGNOSIS — O24319 Unspecified pre-existing diabetes mellitus in pregnancy, unspecified trimester: Secondary | ICD-10-CM

## 2022-06-03 LAB — CBC
HCT: 35 % — ABNORMAL LOW (ref 36.0–46.0)
Hemoglobin: 12.6 g/dL (ref 12.0–15.0)
MCH: 26.4 pg (ref 26.0–34.0)
MCHC: 36 g/dL (ref 30.0–36.0)
MCV: 73.2 fL — ABNORMAL LOW (ref 80.0–100.0)
Platelets: 351 10*3/uL (ref 150–400)
RBC: 4.78 MIL/uL (ref 3.87–5.11)
RDW: 15 % (ref 11.5–15.5)
WBC: 7.4 10*3/uL (ref 4.0–10.5)
nRBC: 0 % (ref 0.0–0.2)

## 2022-06-03 LAB — COMPREHENSIVE METABOLIC PANEL
ALT: 20 U/L (ref 0–44)
AST: 18 U/L (ref 15–41)
Albumin: 2.6 g/dL — ABNORMAL LOW (ref 3.5–5.0)
Alkaline Phosphatase: 184 U/L — ABNORMAL HIGH (ref 38–126)
Anion gap: 9 (ref 5–15)
BUN: 7 mg/dL (ref 6–20)
CO2: 19 mmol/L — ABNORMAL LOW (ref 22–32)
Calcium: 8.9 mg/dL (ref 8.9–10.3)
Chloride: 107 mmol/L (ref 98–111)
Creatinine, Ser: 0.7 mg/dL (ref 0.44–1.00)
GFR, Estimated: >60 mL/min (ref >60)
Glucose, Bld: 102 mg/dL — ABNORMAL HIGH (ref 70–99)
Potassium: 3.5 mmol/L (ref 3.5–5.1)
Sodium: 135 mmol/L (ref 135–145)
Total Bilirubin: 0.5 mg/dL (ref 0.3–1.2)
Total Protein: 6.8 g/dL (ref 6.5–8.1)

## 2022-06-03 LAB — PROTEIN / CREATININE RATIO, URINE
Creatinine, Urine: 119 mg/dL
Protein Creatinine Ratio: 0.17 mg/mg{Cre} — ABNORMAL HIGH (ref 0.00–0.15)
Total Protein, Urine: 20 mg/dL

## 2022-06-03 NOTE — MAU Provider Note (Cosign Needed Addendum)
History     CSN: [id number]  Arrival date and time: 06/03/22 1533   Event Date/Time   First Provider Initiated Contact with Patient 06/03/22 1704      Chief Complaint  Patient presents with   Hypertension   HPI Helen Dorsey is a 31 y.o. G1P0 at [redacted]w[redacted]d who presents to MAU from MFM for evaluation of new onset Hypertension. She denies history of Hypertension. She denies headache, visual disturbances, RUQ/epigastric pain, new onset swelling or weight gain. She also denies vaginal bleeding, leaking of fluid, decreased fetal movement, fever, falls, or recent illness.   Patient receives care with [facility], Dr. Richardson Dopp is her primary.  OB History     Gravida  1   Para      Term      Preterm      AB      Living         SAB      IAB      Ectopic      Multiple      Live Births              Past Medical History:  Diagnosis Date   Diabetes mellitus without complication (HCC)     Past Surgical History:  Procedure Laterality Date   NO PAST SURGERIES      Family History  Problem Relation Age of Onset   Heart disease Mother    Hypertension Mother    Diabetes Sister    Diabetes Brother    Cancer Maternal Grandmother    Asthma Neg Hx    Stroke Neg Hx     Social History   Tobacco Use   Smoking status: Former    Types: Cigarettes    Quit date: 11/21/2021    Years since quitting: 0.5   Smokeless tobacco: Never  Vaping Use   Vaping Use: Never used  Substance Use Topics   Alcohol use: Not Currently    Comment: not while preg   Drug use: No    Allergies: No Known Allergies  Medications Prior to Admission  Medication Sig Dispense Refill Last Dose   aspirin EC 81 MG tablet Take 81 mg by mouth daily. Swallow whole.   06/03/2022   Insulin Pen Needle (PEN NEEDLES) 33G X 4 MM MISC 1 each by Does not apply route in the morning, at noon, in the evening, and at bedtime. 100 each 0 06/03/2022   metFORMIN (GLUCOPHAGE) 500 MG tablet Take by mouth 2 (two)  times daily with a meal.   06/02/2022   insulin NPH Human (NOVOLIN N) 100 UNIT/ML injection Inject 42 Units into the skin 2 (two) times daily before a meal. 42u in AM, 15u PM      insulin regular (NOVOLIN R) 100 units/mL injection Inject 20 Units into the skin 2 (two) times daily before a meal. 15u in PM      Prenatal Vit-Fe Fumarate-FA (PRENATAL MULTIVITAMIN) TABS tablet Take 1 tablet by mouth daily at 12 noon.       Review of Systems  All other systems reviewed and are negative.  Physical Exam   Blood pressure (!) 138/99, pulse 95, temperature 97.8 F (36.6 C), resp. rate 20, last menstrual period 02/23/2021, SpO2 99 %.  Physical Exam Vitals and nursing note reviewed. Exam conducted with a chaperone present.  Constitutional:      Appearance: Normal appearance. She is not ill-appearing.  Cardiovascular:     Rate and Rhythm: Normal rate and regular  rhythm.     Pulses: Normal pulses.     Heart sounds: Normal heart sounds.  Pulmonary:     Effort: Pulmonary effort is normal.     Breath sounds: Normal breath sounds.  Abdominal:     Comments: Gravid  Skin:    Capillary Refill: Capillary refill takes less than 2 seconds.  Neurological:     Mental Status: She is alert and oriented to person, place, and time.  Psychiatric:        Mood and Affect: Mood normal.        Behavior: Behavior normal.        Thought Content: Thought content normal.        Judgment: Judgment normal.     MAU Course  Procedures  MDM  --Elevated BP in MFM encounter as documented on 05/19/2022. Now meets criteria for Gestational HTN --Reactive tracing: baseline 135, mod var, + accels, no decels --Toco: rare contraction, not felt by patient  Patient Vitals for the past 24 hrs:  BP Temp Pulse Resp SpO2  06/03/22 1700 (!) 138/99 -- 95 -- 99 %  06/03/22 1645 (!) 144/104 -- 92 -- 99 %  06/03/22 1630 (!) 141/99 -- 99 -- 99 %  06/03/22 1616 (!) 137/99 -- 97 -- 99 %  06/03/22 1601 (!) 130/103 97.8 F (36.6 C)  (!) 107 20 98 %   Results for orders placed or performed during the hospital encounter of 06/03/22 (from the past 24 hour(s))  CBC     Status: Abnormal   Collection Time: 06/03/22  4:04 PM  Result Value Ref Range   WBC 7.4 4.0 - 10.5 K/uL   RBC 4.78 3.87 - 5.11 MIL/uL   Hemoglobin 12.6 12.0 - 15.0 g/dL   HCT 16.1 (L) 09.6 - 04.5 %   MCV 73.2 (L) 80.0 - 100.0 fL   MCH 26.4 26.0 - 34.0 pg   MCHC 36.0 30.0 - 36.0 g/dL   RDW 40.9 81.1 - 91.4 %   Platelets 351 150 - 400 K/uL   nRBC 0.0 0.0 - 0.2 %  Comprehensive metabolic panel     Status: Abnormal   Collection Time: 06/03/22  4:04 PM  Result Value Ref Range   Sodium 135 135 - 145 mmol/L   Potassium 3.5 3.5 - 5.1 mmol/L   Chloride 107 98 - 111 mmol/L   CO2 19 (L) 22 - 32 mmol/L   Glucose, Bld 102 (H) 70 - 99 mg/dL   BUN 7 6 - 20 mg/dL   Creatinine, Ser 7.82 0.44 - 1.00 mg/dL   Calcium 8.9 8.9 - 95.6 mg/dL   Total Protein 6.8 6.5 - 8.1 g/dL   Albumin 2.6 (L) 3.5 - 5.0 g/dL   AST 18 15 - 41 U/L   ALT 20 0 - 44 U/L   Alkaline Phosphatase 184 (H) 38 - 126 U/L   Total Bilirubin 0.5 0.3 - 1.2 mg/dL   GFR, Estimated >21 >30 mL/min   Anion gap 9 5 - 15  Protein / creatinine ratio, urine     Status: Abnormal   Collection Time: 06/03/22  4:13 PM  Result Value Ref Range   Creatinine, Urine 119 mg/dL   Total Protein, Urine 20 mg/dL   Protein Creatinine Ratio 0.17 (H) 0.00 - 0.15 mg/mg[Cre]   Assessment and Plan  --31 y.o. G1P0 at [redacted]w[redacted]d  --Reactive tracing --Gestational Hypertension, PEC labs WNL --Dr. Richardson Dopp called and updated about new GHTN diagnosis --Discharge home in stable condition  F/U: --  Dr. Parke Poisson, MFM called MAU shortly after discharge, stated he would advise office BP check this week. --Dr. Parke Poisson declined CNM 's offer to update Dr. Richardson Dopp with additional call, stated he would call Dr. Richardson Dopp directly   Calvert Cantor, MSA, MSN, CNM 06/03/2022, 7:45 PM

## 2022-06-03 NOTE — MAU Note (Signed)
.  Helen Dorsey is a 31 y.o. at [redacted]w[redacted]d here in MAU reporting: Pt reports she was at the doctor today and her blood pressure was high. Pt reports she was sent for further evaluation of her BP.  Denies vaginal bleeding, Denies LOF  Reports +FM  Onset of complaint: today  Pain score: denies, denies h/a There were no vitals filed for this visit.   Lab orders placed from triage:  urine sent for PCR

## 2022-06-03 NOTE — Progress Notes (Addendum)
MFM Note  Helen Dorsey was seen due to pregestational diabetes treated with insulin and metformin.  Her blood pressures today were 145/95, 142/103, and 153/108.  She denies any signs or symptoms of preeclampsia.  The overall EFW was 5 pounds 3 ounces (45th percentile), indicating that the previously noted IUGR has resolved.    There was normal amniotic fluid noted.    A biophysical profile performed today was 8 out of 8.    The fetus is in the vertex presentation.  The previously noted low-lying placenta has resolved.  She may attempt a vaginal delivery at term should she desire.  Due to her extremely elevated blood pressures, the patient was sent to the MAU immediately following today's ultrasound exam to have PIH labs drawn and to be assessed for preeclampsia.  Should her blood pressures continue to be elevated or should she be diagnosed with preeclampsia, she should be given a course of antenatal corticosteroids and be hospitalized for further management with possible delivery once her steroid course is completed.    Should she be discharged home, she should be seen for twice weekly fetal testing and blood pressure checks.  The patient stated that all of her questions were answered today.  A total of 20 minutes was spent counseling and coordinating the care for this patient.  Greater than 50% of the time was spent in direct face-to-face contact.

## 2022-06-05 DIAGNOSIS — O133 Gestational [pregnancy-induced] hypertension without significant proteinuria, third trimester: Secondary | ICD-10-CM | POA: Diagnosis not present

## 2022-06-05 DIAGNOSIS — O139 Gestational [pregnancy-induced] hypertension without significant proteinuria, unspecified trimester: Secondary | ICD-10-CM | POA: Diagnosis not present

## 2022-06-09 ENCOUNTER — Ambulatory Visit: Payer: BC Managed Care – PPO | Admitting: *Deleted

## 2022-06-09 ENCOUNTER — Ambulatory Visit: Payer: BC Managed Care – PPO | Attending: Obstetrics and Gynecology

## 2022-06-09 ENCOUNTER — Encounter: Payer: Self-pay | Admitting: *Deleted

## 2022-06-09 DIAGNOSIS — O133 Gestational [pregnancy-induced] hypertension without significant proteinuria, third trimester: Secondary | ICD-10-CM | POA: Insufficient documentation

## 2022-06-09 DIAGNOSIS — Z6841 Body Mass Index (BMI) 40.0 and over, adult: Secondary | ICD-10-CM

## 2022-06-09 DIAGNOSIS — E669 Obesity, unspecified: Secondary | ICD-10-CM | POA: Diagnosis not present

## 2022-06-09 DIAGNOSIS — O365930 Maternal care for other known or suspected poor fetal growth, third trimester, not applicable or unspecified: Secondary | ICD-10-CM

## 2022-06-09 DIAGNOSIS — Z3A34 34 weeks gestation of pregnancy: Secondary | ICD-10-CM

## 2022-06-09 DIAGNOSIS — O99213 Obesity complicating pregnancy, third trimester: Secondary | ICD-10-CM

## 2022-06-09 DIAGNOSIS — E119 Type 2 diabetes mellitus without complications: Secondary | ICD-10-CM

## 2022-06-09 DIAGNOSIS — O24113 Pre-existing diabetes mellitus, type 2, in pregnancy, third trimester: Secondary | ICD-10-CM

## 2022-06-09 DIAGNOSIS — Z794 Long term (current) use of insulin: Secondary | ICD-10-CM

## 2022-06-09 DIAGNOSIS — Z7984 Long term (current) use of oral hypoglycemic drugs: Secondary | ICD-10-CM

## 2022-06-10 DIAGNOSIS — Z3490 Encounter for supervision of normal pregnancy, unspecified, unspecified trimester: Secondary | ICD-10-CM | POA: Diagnosis not present

## 2022-06-10 DIAGNOSIS — O133 Gestational [pregnancy-induced] hypertension without significant proteinuria, third trimester: Secondary | ICD-10-CM | POA: Diagnosis not present

## 2022-06-11 ENCOUNTER — Telehealth (HOSPITAL_COMMUNITY): Payer: Self-pay | Admitting: *Deleted

## 2022-06-11 NOTE — Telephone Encounter (Signed)
Preadmission screen  

## 2022-06-12 ENCOUNTER — Encounter (HOSPITAL_COMMUNITY): Payer: Self-pay | Admitting: *Deleted

## 2022-06-18 ENCOUNTER — Ambulatory Visit: Payer: BC Managed Care – PPO | Admitting: *Deleted

## 2022-06-18 ENCOUNTER — Ambulatory Visit: Payer: BC Managed Care – PPO | Attending: Maternal & Fetal Medicine

## 2022-06-18 VITALS — BP 140/93 | HR 92

## 2022-06-18 DIAGNOSIS — E119 Type 2 diabetes mellitus without complications: Secondary | ICD-10-CM | POA: Diagnosis not present

## 2022-06-18 DIAGNOSIS — O133 Gestational [pregnancy-induced] hypertension without significant proteinuria, third trimester: Secondary | ICD-10-CM

## 2022-06-18 DIAGNOSIS — O24113 Pre-existing diabetes mellitus, type 2, in pregnancy, third trimester: Secondary | ICD-10-CM | POA: Diagnosis not present

## 2022-06-18 DIAGNOSIS — O99213 Obesity complicating pregnancy, third trimester: Secondary | ICD-10-CM

## 2022-06-18 DIAGNOSIS — E669 Obesity, unspecified: Secondary | ICD-10-CM

## 2022-06-18 DIAGNOSIS — Z794 Long term (current) use of insulin: Secondary | ICD-10-CM

## 2022-06-18 DIAGNOSIS — Z3A36 36 weeks gestation of pregnancy: Secondary | ICD-10-CM

## 2022-06-18 DIAGNOSIS — O365930 Maternal care for other known or suspected poor fetal growth, third trimester, not applicable or unspecified: Secondary | ICD-10-CM

## 2022-06-19 DIAGNOSIS — O133 Gestational [pregnancy-induced] hypertension without significant proteinuria, third trimester: Secondary | ICD-10-CM | POA: Diagnosis not present

## 2022-06-19 DIAGNOSIS — Z3490 Encounter for supervision of normal pregnancy, unspecified, unspecified trimester: Secondary | ICD-10-CM | POA: Diagnosis not present

## 2022-06-21 LAB — OB RESULTS CONSOLE GBS
GBS: NEGATIVE
GBS: POSITIVE

## 2022-06-23 ENCOUNTER — Other Ambulatory Visit: Payer: Self-pay

## 2022-06-23 ENCOUNTER — Other Ambulatory Visit: Payer: Self-pay | Admitting: Obstetrics and Gynecology

## 2022-06-23 DIAGNOSIS — O133 Gestational [pregnancy-induced] hypertension without significant proteinuria, third trimester: Secondary | ICD-10-CM

## 2022-06-23 NOTE — H&P (Signed)
Helen Dorsey is a 31 y.o. female  G1P0 at 37 weeks and 0 days presenting for induction of labor due to gestational hypertension and uncontrolled type 2 diabetes. Prenatal care provided by Dr .Gerald Leitz with [facility].   Marland Kitchen08/22/2023 EFW 5 lbs 3 oz (45%ile) Posterior placenta.   OB History     Gravida  1   Para      Term      Preterm      AB      Living         SAB      IAB      Ectopic      Multiple      Live Births             Past Medical History:  Diagnosis Date   Diabetes mellitus without complication (HCC)    Pregnancy induced hypertension    Past Surgical History:  Procedure Laterality Date   NO PAST SURGERIES     Family History: family history includes Cancer in her maternal grandmother; Diabetes in her brother and sister; Heart disease in her mother; Hypertension in her mother. Social History:  reports that she quit smoking about 7 months ago. Her smoking use included cigarettes. She has never used smokeless tobacco. She reports that she does not currently use alcohol. She reports that she does not use drugs.     Maternal Diabetes: No Genetic Screening: Declined Maternal Ultrasounds/Referrals: Normal Fetal Ultrasounds or other Referrals:  Fetal echo Maternal Substance Abuse:  No Significant Maternal Medications:  Meds include: Other:  Significant Maternal Lab Results:  Group B Strep positive Number of Prenatal Visits:greater than 3 verified prenatal visits Other Comments:  None  Review of Systems  Constitutional: Negative.   HENT: Negative.    Eyes: Negative.   Respiratory: Negative.    Cardiovascular: Negative.   Gastrointestinal: Negative.   Endocrine: Negative.   Genitourinary: Negative.   Musculoskeletal: Negative.   Skin: Negative.   Allergic/Immunologic: Negative.   Neurological: Negative.   Hematological: Negative.   Psychiatric/Behavioral: Negative.     History   Last menstrual period 02/23/2021. Maternal Exam:   Introitus: Normal vulva.   Physical Exam Vitals reviewed.  Constitutional:      Appearance: Normal appearance.  HENT:     Head: Normocephalic.     Nose: Nose normal.     Mouth/Throat:     Mouth: Mucous membranes are moist.  Cardiovascular:     Rate and Rhythm: Normal rate and regular rhythm.     Pulses: Normal pulses.     Heart sounds: Normal heart sounds.  Pulmonary:     Effort: Pulmonary effort is normal.     Breath sounds: Normal breath sounds.  Abdominal:     Tenderness: There is no abdominal tenderness.  Genitourinary:    General: Normal vulva.     Comments: Cervix closed / thick / high  Musculoskeletal:        General: Swelling present. Normal range of motion.     Cervical back: Normal range of motion and neck supple.  Skin:    General: Skin is warm and dry.  Neurological:     General: No focal deficit present.     Mental Status: She is alert and oriented to person, place, and time.  Psychiatric:        Mood and Affect: Mood normal.        Behavior: Behavior normal.     Prenatal labs: ABO, Rh: O/Positive/-- (03/31  0000) Antibody: Negative (03/31 0000) Rubella: Immune (03/31 0000) RPR: Nonreactive (03/31 0000)  HBsAg: Negative (03/31 0000)  HIV: Non-reactive (03/31 0000)  GBS:   Positive 06/19/2022  Assessment/Plan: 37 weeks and 0 days with gestational hypertension and uncontrolled type 2 diabetes.  - plan Cytotec for cervical ripening.  -Endo tool for control of diabetes..  - Penicillin for GBS prophylaxis in active labor or with rupture  - Continuous EFM - Continue procardia xl 30 mg daily... PIH labs on admission.  - Pain control per patient request  -Anticipate SVD   Gerald Leitz 06/23/2022, 11:02 PM

## 2022-06-23 NOTE — H&P (Deleted)
  The note originally documented on this encounter has been moved the the encounter in which it belongs.  

## 2022-06-24 ENCOUNTER — Inpatient Hospital Stay (HOSPITAL_COMMUNITY)
Admission: AD | Admit: 2022-06-24 | Discharge: 2022-06-26 | DRG: 806 | Disposition: A | Discharge status: Home / Self Care | Payer: BC Managed Care – PPO | Attending: Obstetrics and Gynecology | Admitting: Obstetrics and Gynecology

## 2022-06-24 ENCOUNTER — Encounter (HOSPITAL_COMMUNITY): Payer: Self-pay | Admitting: Obstetrics and Gynecology

## 2022-06-24 ENCOUNTER — Inpatient Hospital Stay (HOSPITAL_COMMUNITY): Payer: BC Managed Care – PPO | Admitting: Anesthesiology

## 2022-06-24 ENCOUNTER — Inpatient Hospital Stay (HOSPITAL_COMMUNITY): Payer: BC Managed Care – PPO

## 2022-06-24 ENCOUNTER — Other Ambulatory Visit: Payer: Self-pay

## 2022-06-24 DIAGNOSIS — E1165 Type 2 diabetes mellitus with hyperglycemia: Secondary | ICD-10-CM | POA: Diagnosis present

## 2022-06-24 DIAGNOSIS — D62 Acute posthemorrhagic anemia: Secondary | ICD-10-CM | POA: Diagnosis not present

## 2022-06-24 DIAGNOSIS — Z87891 Personal history of nicotine dependence: Secondary | ICD-10-CM | POA: Diagnosis not present

## 2022-06-24 DIAGNOSIS — Z3A37 37 weeks gestation of pregnancy: Secondary | ICD-10-CM

## 2022-06-24 DIAGNOSIS — O133 Gestational [pregnancy-induced] hypertension without significant proteinuria, third trimester: Secondary | ICD-10-CM

## 2022-06-24 DIAGNOSIS — O9081 Anemia of the puerperium: Secondary | ICD-10-CM | POA: Diagnosis not present

## 2022-06-24 DIAGNOSIS — O134 Gestational [pregnancy-induced] hypertension without significant proteinuria, complicating childbirth: Principal | ICD-10-CM | POA: Diagnosis present

## 2022-06-24 DIAGNOSIS — O99824 Streptococcus B carrier state complicating childbirth: Secondary | ICD-10-CM | POA: Diagnosis present

## 2022-06-24 DIAGNOSIS — O164 Unspecified maternal hypertension, complicating childbirth: Secondary | ICD-10-CM | POA: Diagnosis not present

## 2022-06-24 DIAGNOSIS — O2412 Pre-existing diabetes mellitus, type 2, in childbirth: Secondary | ICD-10-CM | POA: Diagnosis present

## 2022-06-24 DIAGNOSIS — O139 Gestational [pregnancy-induced] hypertension without significant proteinuria, unspecified trimester: Secondary | ICD-10-CM | POA: Diagnosis present

## 2022-06-24 LAB — CBC
HCT: 33.9 % — ABNORMAL LOW (ref 36.0–46.0)
Hemoglobin: 11.6 g/dL — ABNORMAL LOW (ref 12.0–15.0)
MCH: 25.7 pg — ABNORMAL LOW (ref 26.0–34.0)
MCHC: 34.2 g/dL (ref 30.0–36.0)
MCV: 75.2 fL — ABNORMAL LOW (ref 80.0–100.0)
Platelets: 320 10*3/uL (ref 150–400)
RBC: 4.51 MIL/uL (ref 3.87–5.11)
RDW: 15.4 % (ref 11.5–15.5)
WBC: 6.3 10*3/uL (ref 4.0–10.5)
nRBC: 0 % (ref 0.0–0.2)

## 2022-06-24 LAB — BASIC METABOLIC PANEL
Anion gap: 7 (ref 5–15)
BUN: 6 mg/dL (ref 6–20)
CO2: 20 mmol/L — ABNORMAL LOW (ref 22–32)
Calcium: 9.1 mg/dL (ref 8.9–10.3)
Chloride: 110 mmol/L (ref 98–111)
Creatinine, Ser: 0.69 mg/dL (ref 0.44–1.00)
GFR, Estimated: >60 mL/min (ref >60)
Glucose, Bld: 128 mg/dL — ABNORMAL HIGH (ref 70–99)
Potassium: 3.4 mmol/L — ABNORMAL LOW (ref 3.5–5.1)
Sodium: 137 mmol/L (ref 135–145)

## 2022-06-24 LAB — GLUCOSE, CAPILLARY
Glucose-Capillary: 103 mg/dL — ABNORMAL HIGH (ref 70–99)
Glucose-Capillary: 105 mg/dL — ABNORMAL HIGH (ref 70–99)
Glucose-Capillary: 110 mg/dL — ABNORMAL HIGH (ref 70–99)
Glucose-Capillary: 113 mg/dL — ABNORMAL HIGH (ref 70–99)
Glucose-Capillary: 115 mg/dL — ABNORMAL HIGH (ref 70–99)
Glucose-Capillary: 116 mg/dL — ABNORMAL HIGH (ref 70–99)
Glucose-Capillary: 120 mg/dL — ABNORMAL HIGH (ref 70–99)
Glucose-Capillary: 121 mg/dL — ABNORMAL HIGH (ref 70–99)
Glucose-Capillary: 127 mg/dL — ABNORMAL HIGH (ref 70–99)
Glucose-Capillary: 134 mg/dL — ABNORMAL HIGH (ref 70–99)
Glucose-Capillary: 136 mg/dL — ABNORMAL HIGH (ref 70–99)
Glucose-Capillary: 136 mg/dL — ABNORMAL HIGH (ref 70–99)
Glucose-Capillary: 140 mg/dL — ABNORMAL HIGH (ref 70–99)
Glucose-Capillary: 142 mg/dL — ABNORMAL HIGH (ref 70–99)
Glucose-Capillary: 167 mg/dL — ABNORMAL HIGH (ref 70–99)
Glucose-Capillary: 86 mg/dL (ref 70–99)
Glucose-Capillary: 88 mg/dL (ref 70–99)
Glucose-Capillary: 90 mg/dL (ref 70–99)
Glucose-Capillary: 91 mg/dL (ref 70–99)
Glucose-Capillary: 94 mg/dL (ref 70–99)
Glucose-Capillary: 96 mg/dL (ref 70–99)

## 2022-06-24 LAB — TYPE AND SCREEN
ABO/RH(D): O POS
Antibody Screen: NEGATIVE

## 2022-06-24 LAB — COMPREHENSIVE METABOLIC PANEL
ALT: 24 U/L (ref 0–44)
AST: 23 U/L (ref 15–41)
Albumin: 2.6 g/dL — ABNORMAL LOW (ref 3.5–5.0)
Alkaline Phosphatase: 210 U/L — ABNORMAL HIGH (ref 38–126)
Anion gap: 13 (ref 5–15)
BUN: 7 mg/dL (ref 6–20)
CO2: 19 mmol/L — ABNORMAL LOW (ref 22–32)
Calcium: 9.7 mg/dL (ref 8.9–10.3)
Chloride: 107 mmol/L (ref 98–111)
Creatinine, Ser: 0.67 mg/dL (ref 0.44–1.00)
GFR, Estimated: >60 mL/min (ref >60)
Glucose, Bld: 112 mg/dL — ABNORMAL HIGH (ref 70–99)
Potassium: 3.2 mmol/L — ABNORMAL LOW (ref 3.5–5.1)
Sodium: 139 mmol/L (ref 135–145)
Total Bilirubin: 0.2 mg/dL — ABNORMAL LOW (ref 0.3–1.2)
Total Protein: 6.4 g/dL — ABNORMAL LOW (ref 6.5–8.1)

## 2022-06-24 LAB — HEMOGLOBIN A1C
Hgb A1c MFr Bld: 7.1 % — ABNORMAL HIGH (ref 4.8–5.6)
Mean Plasma Glucose: 157 mg/dL

## 2022-06-24 LAB — PROTEIN / CREATININE RATIO, URINE
Creatinine, Urine: 167 mg/dL
Protein Creatinine Ratio: 0.28 mg/mg{Cre} — ABNORMAL HIGH (ref 0.00–0.15)
Total Protein, Urine: 47 mg/dL

## 2022-06-24 LAB — RPR: RPR Ser Ql: NONREACTIVE

## 2022-06-24 MED ORDER — ACETAMINOPHEN 500 MG PO TABS
1000.0000 mg | ORAL_TABLET | Freq: Four times a day (QID) | ORAL | Status: DC | PRN
  Administered 2022-06-24: 1000 mg via ORAL
  Filled 2022-06-24: qty 2

## 2022-06-24 MED ORDER — INSULIN REGULAR(HUMAN) IN NACL 100-0.9 UT/100ML-% IV SOLN
INTRAVENOUS | Status: DC
  Administered 2022-06-24: 1.8 [IU]/h via INTRAVENOUS
  Filled 2022-06-24: qty 100

## 2022-06-24 MED ORDER — SODIUM CHLORIDE 0.9 % IV SOLN
5.0000 10*6.[IU] | Freq: Once | INTRAVENOUS | Status: AC
  Administered 2022-06-24: 5 10*6.[IU] via INTRAVENOUS
  Filled 2022-06-24: qty 5

## 2022-06-24 MED ORDER — LIDOCAINE HCL (PF) 1 % IJ SOLN
INTRAMUSCULAR | Status: DC | PRN
  Administered 2022-06-24: 8 mL via EPIDURAL

## 2022-06-24 MED ORDER — TERBUTALINE SULFATE 1 MG/ML IJ SOLN: 0.2500 mg | Freq: Once | INTRAMUSCULAR | Status: DC | PRN

## 2022-06-24 MED ORDER — HYDROXYZINE HCL 50 MG PO TABS: 50.0000 mg | ORAL_TABLET | Freq: Four times a day (QID) | ORAL | Status: DC | PRN

## 2022-06-24 MED ORDER — OXYTOCIN-SODIUM CHLORIDE 30-0.9 UT/500ML-% IV SOLN: 1.0000 m[IU]/min | INTRAVENOUS | Status: DC

## 2022-06-24 MED ORDER — FENTANYL-BUPIVACAINE-NACL 0.5-0.125-0.9 MG/250ML-% EP SOLN
EPIDURAL | Status: AC
  Administered 2022-06-25: 12 mL/h via EPIDURAL
  Filled 2022-06-24: qty 250

## 2022-06-24 MED ORDER — EPHEDRINE 5 MG/ML INJ: 10.0000 mg | INTRAVENOUS | Status: DC | PRN

## 2022-06-24 MED ORDER — DEXTROSE IN LACTATED RINGERS 5 % IV SOLN: INTRAVENOUS | Status: DC

## 2022-06-24 MED ORDER — INSULIN ASPART 100 UNIT/ML IJ SOLN: 0.0000 [IU] | Freq: Three times a day (TID) | INTRAMUSCULAR | Status: DC

## 2022-06-24 MED ORDER — FENTANYL-BUPIVACAINE-NACL 0.5-0.125-0.9 MG/250ML-% EP SOLN: 12.0000 mL/h | EPIDURAL | Status: DC | PRN

## 2022-06-24 MED ORDER — OXYTOCIN-SODIUM CHLORIDE 30-0.9 UT/500ML-% IV SOLN
1.0000 m[IU]/min | INTRAVENOUS | Status: DC
  Administered 2022-06-24: 1 m[IU]/min via INTRAVENOUS

## 2022-06-24 MED ORDER — OXYTOCIN BOLUS FROM INFUSION
333.0000 mL | Freq: Once | INTRAVENOUS | Status: AC
  Administered 2022-06-25: 333 mL via INTRAVENOUS

## 2022-06-24 MED ORDER — SOD CITRATE-CITRIC ACID 500-334 MG/5ML PO SOLN
30.0000 mL | ORAL | Status: DC | PRN
  Administered 2022-06-24: 30 mL via ORAL
  Filled 2022-06-24: qty 30

## 2022-06-24 MED ORDER — SODIUM CHLORIDE 0.9 % IV SOLN
3.0000 g | Freq: Four times a day (QID) | INTRAVENOUS | Status: DC
  Administered 2022-06-25 (×2): 3 g via INTRAVENOUS
  Filled 2022-06-24 (×3): qty 8

## 2022-06-24 MED ORDER — FENTANYL CITRATE (PF) 100 MCG/2ML IJ SOLN: 50.0000 ug | INTRAMUSCULAR | Status: DC | PRN

## 2022-06-24 MED ORDER — OXYCODONE-ACETAMINOPHEN 5-325 MG PO TABS: 2.0000 | ORAL_TABLET | ORAL | Status: DC | PRN

## 2022-06-24 MED ORDER — PENICILLIN G POT IN DEXTROSE 60000 UNIT/ML IV SOLN
3.0000 10*6.[IU] | INTRAVENOUS | Status: DC
  Administered 2022-06-24 (×5): 3 10*6.[IU] via INTRAVENOUS
  Filled 2022-06-24 (×5): qty 50

## 2022-06-24 MED ORDER — OXYCODONE-ACETAMINOPHEN 5-325 MG PO TABS: 1.0000 | ORAL_TABLET | ORAL | Status: DC | PRN

## 2022-06-24 MED ORDER — LACTATED RINGERS IV SOLN
500.0000 mL | INTRAVENOUS | Status: DC | PRN
  Administered 2022-06-24: 500 mL via INTRAVENOUS

## 2022-06-24 MED ORDER — DIPHENHYDRAMINE HCL 50 MG/ML IJ SOLN: 12.5000 mg | INTRAMUSCULAR | Status: DC | PRN

## 2022-06-24 MED ORDER — MISOPROSTOL 25 MCG QUARTER TABLET
25.0000 ug | ORAL_TABLET | Freq: Once | ORAL | Status: AC
  Administered 2022-06-24: 25 ug via VAGINAL
  Filled 2022-06-24: qty 1

## 2022-06-24 MED ORDER — LIDOCAINE HCL (PF) 1 % IJ SOLN: 30.0000 mL | INTRAMUSCULAR | Status: DC | PRN

## 2022-06-24 MED ORDER — LACTATED RINGERS IV SOLN: 500.0000 mL | Freq: Once | INTRAVENOUS | Status: DC

## 2022-06-24 MED ORDER — ACETAMINOPHEN 325 MG PO TABS: 650.0000 mg | ORAL_TABLET | ORAL | Status: DC | PRN

## 2022-06-24 MED ORDER — DEXTROSE 50 % IV SOLN: 0.0000 mL | INTRAVENOUS | Status: DC | PRN

## 2022-06-24 MED ORDER — LACTATED RINGERS IV SOLN: INTRAVENOUS | Status: DC

## 2022-06-24 MED ORDER — PHENYLEPHRINE 80 MCG/ML (10ML) SYRINGE FOR IV PUSH (FOR BLOOD PRESSURE SUPPORT): 80.0000 ug | PREFILLED_SYRINGE | INTRAVENOUS | Status: DC | PRN

## 2022-06-24 MED ORDER — ONDANSETRON HCL 4 MG/2ML IJ SOLN
4.0000 mg | Freq: Four times a day (QID) | INTRAMUSCULAR | Status: DC | PRN
  Administered 2022-06-25: 4 mg via INTRAVENOUS
  Filled 2022-06-24: qty 2

## 2022-06-24 MED ORDER — MISOPROSTOL 25 MCG QUARTER TABLET
25.0000 ug | ORAL_TABLET | Freq: Once | ORAL | Status: AC
  Administered 2022-06-24: 25 ug via ORAL
  Filled 2022-06-24: qty 1

## 2022-06-24 MED ORDER — OXYTOCIN-SODIUM CHLORIDE 30-0.9 UT/500ML-% IV SOLN
2.5000 [IU]/h | INTRAVENOUS | Status: DC
  Administered 2022-06-25: 2.5 [IU]/h via INTRAVENOUS
  Filled 2022-06-24 (×2): qty 500

## 2022-06-24 MED ORDER — FENTANYL-BUPIVACAINE-NACL 0.5-0.125-0.9 MG/250ML-% EP SOLN
12.0000 mL/h | EPIDURAL | Status: DC | PRN
  Administered 2022-06-24: 12 mL/h via EPIDURAL
  Filled 2022-06-24: qty 250

## 2022-06-24 MED ORDER — NIFEDIPINE ER OSMOTIC RELEASE 30 MG PO TB24
30.0000 mg | ORAL_TABLET | Freq: Every day | ORAL | Status: DC
  Administered 2022-06-24 – 2022-06-26 (×3): 30 mg via ORAL
  Filled 2022-06-24 (×4): qty 1

## 2022-06-24 NOTE — Inpatient Diabetes Management (Signed)
Inpatient Diabetes Program Recommendations  Diabetes Treatment Program Recommendations  ADA Standards of Care Diabetes in Pregnancy Target Glucose Ranges:  Fasting: 70 - 95 mg/dL 1 hr postprandial: Less than 140mg /dL (from first bite of meal) 2 hr postprandial: Less than 120 mg/dL (from first bite of meal)     Latest Reference Range & Units 06/24/22 02:30 06/24/22 03:35 06/24/22 05:31 06/24/22 06:32 06/24/22 07:53  Glucose-Capillary 70 - 99 mg/dL 94 08/24/22 (H) 144 (H) 315 (H) 113 (H)    Latest Reference Range & Units 06/24/22 00:46 06/24/22 01:43  Glucose-Capillary 70 - 99 mg/dL 08/24/22 (H) 867 (H)   Review of Glycemic Control  Diabetes history: DM2, [redacted]W[redacted]D Outpatient Diabetes medications: NPH 27 units QAM, NPH 25 units QPM, Regular 27 units QAM, Regular 25 units QPM, Metformin 500 mg BID Current orders for Inpatient glycemic control: IV insulin  Inpatient Diabetes Program Recommendations:    Insulin: IV insulin should be continued until delivery. After delivery, please consider ordering CBGs AC&HS with Novolog 0-9 units AC&HS.  NOTE: Patient with DM2 hx and prior to pregnancy per office note on 03/13/21 by 05/13/21, NP patient was prescribed Basaglar 10 units daily and Novolog 0-12 units for correction when needed. Patient has been using NPH, Regular, and Metformin as noted above for DM control during pregnancy.   Thanks, Gwinda Passe, RN, MSN, CDCES Diabetes Coordinator Inpatient Diabetes Program [phone] (Team Pager from 8am to 5pm)

## 2022-06-24 NOTE — Progress Notes (Signed)
Patient ID: Helen Dorsey, female   DOB: 26-Apr-1991, 31 y.o.   MRN: [id number] Chart reviewed remotely; NST: 160 baseline, moderate variability, reactive, Contractions Q 2 to 6 minutes. With no decelerations.  CVX: deferred. 31 y/o G1P0 at [redacted] weeks EGA, Induction of labor for gestational hypertension, DM2, Continue with pitocin titration. Dr. Hoover Browns.  06/24/2022.

## 2022-06-24 NOTE — Progress Notes (Signed)
  Subjective: Patient is comfortable with her epidural.   Objective: BP 125/81   Pulse 91   Temp 99.2 F (37.3 C) (Oral)   Resp 18   Ht 5\' 2"  (1.575 m)   Wt 105.5 kg   LMP 02/23/2021   SpO2 100%   BMI 42.54 kg/m  No intake/output data recorded. No intake/output data recorded.  FHT:  FHR: 155 bpm, variability: moderate,  accelerations:  Present,  decelerations:  Absent UC:   regular, every 2-4 minutes SVE:   Dilation: 6 Effacement (%): 90 Station: -1 Exam by:: [email or web address] MD  Labs: Lab Results  Component Value Date   WBC 6.3 06/24/2022   HGB 11.6 (L) 06/24/2022   HCT 33.9 (L) 06/24/2022   MCV 75.2 (L) 06/24/2022   PLT 320 06/24/2022    Assessment / Plan: Induction of labor due to gestational hypertension,  progressing well on pitocin  Labor: Progressing normally Preeclampsia:  labs stable Fetal Wellbeing:  Category I and Category II. Currently category 1 Pain Control:  Epidural I/D:   Penicillin Anticipated MOD:  NSVD Dr. 08/24/2022 with [facility] assuming care starting at 7 pm this evening.   Sallye Ober, MD 06/24/2022, 5:39 PM

## 2022-06-24 NOTE — Progress Notes (Signed)
Labs and tracing reviewed with RN.   Subjective:    Resting.   Objective:    VS: BP 116/83   Pulse 100   Temp 98.1 F (36.7 C) (Oral)   Resp 16   Ht 5\' 2"  (1.575 m)   Wt 105.5 kg   LMP 02/23/2021   BMI 42.54 kg/m  FHR : baseline 150 / variability moderate / accelerations present / absent decelerations Toco: contractions irreg Membranes: intact Dilation: Closed Effacement (%): Thick Cervical Position: Posterior Station: -2 Presentation: Vertex Exam by:: Siana Ansah-Mensah, rnc CMP     Component Value Date/Time   NA 139 06/24/2022 0105   K 3.2 (L) 06/24/2022 0105   CL 107 06/24/2022 0105   CO2 19 (L) 06/24/2022 0105   GLUCOSE 112 (H) 06/24/2022 0105   BUN 7 06/24/2022 0105   CREATININE 0.67 06/24/2022 0105   CALCIUM 9.7 06/24/2022 0105   PROT 6.4 (L) 06/24/2022 0105   ALBUMIN 2.6 (L) 06/24/2022 0105   AST 23 06/24/2022 0105   ALT 24 06/24/2022 0105   ALKPHOS 210 (H) 06/24/2022 0105   BILITOT 0.2 (L) 06/24/2022 0105   GFRNONAA >60 06/24/2022 0105   Protein creatinine ratio 0.28  CBG (last 3)  Recent Labs    06/24/22 0143 06/24/22 0230 06/24/22 0335  GLUCAP 105* 94 115*     Assessment/Plan:   31 y.o. G1P0 [redacted]w[redacted]d IOL for GHTN and uncontrolled Type 2 DM    -normotensive to mild range    -negative neuro symptoms    -continue procardia XL 30 mg PO daily    -endo tool in use    -normal range blood glucose  Labor:  Cytotec  25 mcg oral and 25 mcg buccal Fetal Wellbeing:  Category I I/D:   GBS positive , PCN started Anticipated MOD:  NSVD  [redacted]w[redacted]d DNP, CNM 06/24/2022 4:38 AM

## 2022-06-24 NOTE — Progress Notes (Signed)
  Subjective: Patient rates her contractions as 7 out of 10. Leaking clear fluid. No vaginal bleeding + FM . No headache visual disturbance or ruq pain.   Objective: BP (!) 145/94   Pulse 89   Temp 97.6 F (36.4 C) (Oral)   Resp 18   Ht 5\' 2"  (1.575 m)   Wt 105.5 kg   LMP 02/23/2021   BMI 42.54 kg/m  No intake/output data recorded. No intake/output data recorded.  FHT:  FHR: 150 bpm, variability: moderate,  accelerations:  Present,  decelerations:  Absent UC:   irregular, every 5 minutes SVE:   1.5/80/-2 clear fluid   Labs: Lab Results  Component Value Date   WBC 6.3 06/24/2022   HGB 11.6 (L) 06/24/2022   HCT 33.9 (L) 06/24/2022   MCV 75.2 (L) 06/24/2022   PLT 320 06/24/2022    Assessment / Plan: Induction of labor due to gestational hypertension and uncontrolled type 2 diabetes   Labor:  start pitocin 1x1 Preeclampsia:  labs stable Fetal Wellbeing:  Category I Pain Control:  Labor support without medications I/D:   Penicillin Anticipated MOD:  NSVD  08/24/2022, MD 06/24/2022, 12:15 PM

## 2022-06-24 NOTE — Anesthesia Preprocedure Evaluation (Signed)
Anesthesia Evaluation  Patient identified by MRN, date of birth, ID band Patient awake    Reviewed: Allergy & Precautions, H&P , NPO status , Patient's Chart, lab work & pertinent test results, reviewed documented beta blocker date and time   Airway Mallampati: III  TM Distance: >3 FB Neck ROM: full    Dental no notable dental hx. (+) Teeth Intact, Dental Advisory Given   Pulmonary neg pulmonary ROS, former smoker,    Pulmonary exam normal breath sounds clear to auscultation       Cardiovascular hypertension, Pt. on medications negative cardio ROS Normal cardiovascular exam Rhythm:regular Rate:Normal     Neuro/Psych negative neurological ROS  negative psych ROS   GI/Hepatic negative GI ROS, Neg liver ROS,   Endo/Other  negative endocrine ROSdiabetes  Renal/GU negative Renal ROS  negative genitourinary   Musculoskeletal   Abdominal   Peds  Hematology negative hematology ROS (+)   Anesthesia Other Findings   Reproductive/Obstetrics (+) Pregnancy                             Anesthesia Physical Anesthesia Plan  ASA: 3  Anesthesia Plan: Epidural   Post-op Pain Management: Minimal or no pain anticipated   Induction:   PONV Risk Score and Plan: 2 and Treatment may vary due to age or medical condition  Airway Management Planned: Natural Airway  Additional Equipment: None  Intra-op Plan:   Post-operative Plan:   Informed Consent: I have reviewed the patients History and Physical, chart, labs and discussed the procedure including the risks, benefits and alternatives for the proposed anesthesia with the patient or authorized representative who has indicated his/her understanding and acceptance.       Plan Discussed with: Anesthesiologist  Anesthesia Plan Comments:         Anesthesia Quick Evaluation

## 2022-06-24 NOTE — Anesthesia Procedure Notes (Signed)
Epidural Patient location during procedure: OB Start time: 06/24/2022 9:42 AM End time: 06/24/2022 9:50 AM  Staffing Anesthesiologist: Bethena Midget, MD  Preanesthetic Checklist Completed: patient identified, IV checked, site marked, risks and benefits discussed, surgical consent, monitors and equipment checked, pre-op evaluation and timeout performed  Epidural Patient position: sitting Prep: DuraPrep and site prepped and draped Patient monitoring: continuous pulse ox and blood pressure Approach: midline Location: L3-L4 Injection technique: LOR air  Needle:  Needle type: Tuohy  Needle gauge: 17 G Needle length: 9 cm and 9 Needle insertion depth: 8 cm Catheter type: closed end flexible Catheter size: 19 Gauge Catheter at skin depth: 13 cm Test dose: negative  Assessment Events: blood not aspirated, injection not painful, no injection resistance, no paresthesia and negative IV test

## 2022-06-25 ENCOUNTER — Ambulatory Visit: Payer: Medicaid Other

## 2022-06-25 ENCOUNTER — Encounter (HOSPITAL_COMMUNITY): Payer: Self-pay | Admitting: Obstetrics and Gynecology

## 2022-06-25 DIAGNOSIS — O134 Gestational [pregnancy-induced] hypertension without significant proteinuria, complicating childbirth: Secondary | ICD-10-CM

## 2022-06-25 DIAGNOSIS — O99824 Streptococcus B carrier state complicating childbirth: Secondary | ICD-10-CM

## 2022-06-25 DIAGNOSIS — O2412 Pre-existing diabetes mellitus, type 2, in childbirth: Secondary | ICD-10-CM

## 2022-06-25 DIAGNOSIS — Z3A37 37 weeks gestation of pregnancy: Secondary | ICD-10-CM

## 2022-06-25 LAB — CBC
HCT: 30.7 % — ABNORMAL LOW (ref 36.0–46.0)
Hemoglobin: 11 g/dL — ABNORMAL LOW (ref 12.0–15.0)
MCH: 26.5 pg (ref 26.0–34.0)
MCHC: 35.8 g/dL (ref 30.0–36.0)
MCV: 74 fL — ABNORMAL LOW (ref 80.0–100.0)
Platelets: 271 10*3/uL (ref 150–400)
RBC: 4.15 MIL/uL (ref 3.87–5.11)
RDW: 15.1 % (ref 11.5–15.5)
WBC: 10.7 10*3/uL — ABNORMAL HIGH (ref 4.0–10.5)
nRBC: 0 % (ref 0.0–0.2)

## 2022-06-25 LAB — GLUCOSE, CAPILLARY
Glucose-Capillary: 105 mg/dL — ABNORMAL HIGH (ref 70–99)
Glucose-Capillary: 118 mg/dL — ABNORMAL HIGH (ref 70–99)
Glucose-Capillary: 120 mg/dL — ABNORMAL HIGH (ref 70–99)
Glucose-Capillary: 124 mg/dL — ABNORMAL HIGH (ref 70–99)
Glucose-Capillary: 151 mg/dL — ABNORMAL HIGH (ref 70–99)
Glucose-Capillary: 84 mg/dL (ref 70–99)
Glucose-Capillary: 88 mg/dL (ref 70–99)
Glucose-Capillary: 93 mg/dL (ref 70–99)
Glucose-Capillary: 95 mg/dL (ref 70–99)
Glucose-Capillary: 96 mg/dL (ref 70–99)

## 2022-06-25 LAB — BASIC METABOLIC PANEL
Anion gap: 12 (ref 5–15)
Anion gap: 8 (ref 5–15)
BUN: <5 mg/dL — ABNORMAL LOW (ref 6–20)
BUN: <5 mg/dL — ABNORMAL LOW (ref 6–20)
CO2: 18 mmol/L — ABNORMAL LOW (ref 22–32)
CO2: 21 mmol/L — ABNORMAL LOW (ref 22–32)
Calcium: 8.5 mg/dL — ABNORMAL LOW (ref 8.9–10.3)
Calcium: 8.4 mg/dL — ABNORMAL LOW (ref 8.9–10.3)
Chloride: 105 mmol/L (ref 98–111)
Chloride: 110 mmol/L (ref 98–111)
Creatinine, Ser: 0.68 mg/dL (ref 0.44–1.00)
Creatinine, Ser: 0.81 mg/dL (ref 0.44–1.00)
GFR, Estimated: >60 mL/min (ref >60)
GFR, Estimated: >60 mL/min (ref >60)
Glucose, Bld: 131 mg/dL — ABNORMAL HIGH (ref 70–99)
Glucose, Bld: 113 mg/dL — ABNORMAL HIGH (ref 70–99)
Potassium: 3.2 mmol/L — ABNORMAL LOW (ref 3.5–5.1)
Potassium: 3.8 mmol/L (ref 3.5–5.1)
Sodium: 135 mmol/L (ref 135–145)
Sodium: 139 mmol/L (ref 135–145)

## 2022-06-25 MED ORDER — OXYCODONE HCL 5 MG PO TABS: 5.0000 mg | ORAL_TABLET | ORAL | Status: DC | PRN

## 2022-06-25 MED ORDER — OXYCODONE HCL 5 MG PO TABS: 10.0000 mg | ORAL_TABLET | ORAL | Status: DC | PRN

## 2022-06-25 MED ORDER — INSULIN ASPART 100 UNIT/ML IJ SOLN
0.0000 [IU] | Freq: Three times a day (TID) | INTRAMUSCULAR | Status: DC
  Administered 2022-06-25: 4 [IU] via SUBCUTANEOUS

## 2022-06-25 MED ORDER — SENNOSIDES-DOCUSATE SODIUM 8.6-50 MG PO TABS
2.0000 | ORAL_TABLET | Freq: Every day | ORAL | Status: DC
  Administered 2022-06-26: 2 via ORAL
  Filled 2022-06-25: qty 2

## 2022-06-25 MED ORDER — DIBUCAINE (PERIANAL) 1 % EX OINT: 1.0000 | TOPICAL_OINTMENT | CUTANEOUS | Status: DC | PRN

## 2022-06-25 MED ORDER — PRENATAL MULTIVITAMIN CH
1.0000 | ORAL_TABLET | Freq: Every day | ORAL | Status: DC
  Administered 2022-06-25 – 2022-06-26 (×2): 1 via ORAL
  Filled 2022-06-25 (×2): qty 1

## 2022-06-25 MED ORDER — BENZOCAINE-MENTHOL 20-0.5 % EX AERO: 1.0000 | INHALATION_SPRAY | CUTANEOUS | Status: DC | PRN

## 2022-06-25 MED ORDER — TRANEXAMIC ACID-NACL 1000-0.7 MG/100ML-% IV SOLN
INTRAVENOUS | Status: AC
  Administered 2022-06-25: 1000 mg via INTRAVENOUS
  Filled 2022-06-25: qty 100

## 2022-06-25 MED ORDER — IBUPROFEN 600 MG PO TABS
600.0000 mg | ORAL_TABLET | Freq: Four times a day (QID) | ORAL | Status: DC
  Administered 2022-06-25 – 2022-06-26 (×5): 600 mg via ORAL
  Filled 2022-06-25 (×4): qty 1

## 2022-06-25 MED ORDER — ONDANSETRON HCL 4 MG/2ML IJ SOLN: 4.0000 mg | INTRAMUSCULAR | Status: DC | PRN

## 2022-06-25 MED ORDER — WITCH HAZEL-GLYCERIN EX PADS: 1.0000 | MEDICATED_PAD | CUTANEOUS | Status: DC | PRN

## 2022-06-25 MED ORDER — INSULIN ASPART 100 UNIT/ML IJ SOLN
6.0000 [IU] | Freq: Three times a day (TID) | INTRAMUSCULAR | Status: DC
  Administered 2022-06-25 – 2022-06-26 (×4): 6 [IU] via SUBCUTANEOUS

## 2022-06-25 MED ORDER — COCONUT OIL OIL: 1.0000 | TOPICAL_OIL | Status: DC | PRN

## 2022-06-25 MED ORDER — TRANEXAMIC ACID-NACL 1000-0.7 MG/100ML-% IV SOLN: 1000.0000 mg | INTRAVENOUS | Status: AC

## 2022-06-25 MED ORDER — ONDANSETRON HCL 4 MG PO TABS: 4.0000 mg | ORAL_TABLET | ORAL | Status: DC | PRN

## 2022-06-25 MED ORDER — DIPHENHYDRAMINE HCL 25 MG PO CAPS: 25.0000 mg | ORAL_CAPSULE | Freq: Four times a day (QID) | ORAL | Status: DC | PRN

## 2022-06-25 MED ORDER — SIMETHICONE 80 MG PO CHEW: 80.0000 mg | CHEWABLE_TABLET | ORAL | Status: DC | PRN

## 2022-06-25 MED ORDER — ZOLPIDEM TARTRATE 5 MG PO TABS: 5.0000 mg | ORAL_TABLET | Freq: Every evening | ORAL | Status: DC | PRN

## 2022-06-25 MED ORDER — POTASSIUM CHLORIDE 10 MEQ/100ML IV SOLN
10.0000 meq | INTRAVENOUS | Status: AC
  Administered 2022-06-25 (×3): 10 meq via INTRAVENOUS
  Filled 2022-06-25 (×3): qty 100

## 2022-06-25 NOTE — Lactation Note (Signed)
This note was copied from a baby's chart. Lactation Consultation Note  Patient Name: Helen Dorsey Date: 06/25/2022 Reason for consult: L&D Initial assessment;Primapara;Early term 37-38.6wks;Maternal endocrine disorder Age:33 hours Noted breast tissue w/slight edema to areola. Finger massage to soften and helpful. Placed baby in football hold . Placed nipple in baby's mouth baby suckle a few times but not feeding, held nipple then let go several times. Baby sleepy. Placed baby on mom's chest and covered baby. Discussed w/mom having diabetes and baby's glucose would drop w/not feeding. Mom is fine w/supplementing w/formula. LC called nursery for formula.  Mom will be f/u on MBU.  Maternal Data Does the patient have breastfeeding experience prior to this delivery?: No  Feeding    LATCH Score Latch: Too sleepy or reluctant, no latch achieved, no sucking elicited.  Audible Swallowing: None  Type of Nipple: Everted at rest and after stimulation  Comfort (Breast/Nipple): Soft / non-tender  Hold (Positioning): Full assist, staff holds infant at breast  LATCH Score: 4   Lactation Tools Discussed/Used    Interventions Interventions: Adjust position;Assisted with latch;Support pillows;Skin to skin;Position options;Breast massage;Breast compression  Discharge    Consult Status Consult Status: Follow-up from L&D Date: 06/25/22 Follow-up type: In-patient    Charyl Dancer 06/25/2022, 7:19 AM

## 2022-06-25 NOTE — Progress Notes (Signed)
  Attestation of Attending Supervision of OB Family Medicine Fellow: Evaluation and management procedures were performed by the [facility] Family Medicine Fellow under my supervision and collaboration.  I have reviewed the Fellow's note and chart, and I agree with the management and plan.   I was unable to be physically present for this patient's delivery as I had another patient delivering at the same time.  RN called OB fellow for the delivery, please see OB fellow's delivery note.  I assessed this patient after the delivery and found her and her baby to be doing well and in stable condition in the room.   Dr. Hoover Browns. 06/25/2022. 08:29 AM.

## 2022-06-25 NOTE — Lactation Note (Signed)
This note was copied from a baby's chart. Lactation Consultation Note  Patient Name: Helen Dorsey [id number] Date: 06/25/2022 Reason for consult: Initial assessment;Primapara;Early term 37-38.6wks;Maternal endocrine disorder Age:33 hours Baby hasn't latched to the breast at all. Has no interest in BF. Very sleepy. Mom is doing a lot of STS, RN and LC spoon fed baby colostrum. Attempted suck training w/gloved finger and baby wouldn't suck at all. Baby did how ever take the colostrum from spoon well. Baby did take formula from bottle. Suggested that parents attempt to BF first, hand express give colostrum and then supplement w/formula. Mom is pumping but not collecting anything. Explained normal. Encouraged to alert staff for jitteriness.  Maternal Data Has patient been taught Hand Expression?: Yes Does the patient have breastfeeding experience prior to this delivery?: No  Feeding Nipple Type: Slow - flow  LATCH Score Latch: Too sleepy or reluctant, no latch achieved, no sucking elicited.  Audible Swallowing: None  Type of Nipple: Everted at rest and after stimulation  Comfort (Breast/Nipple): Soft / non-tender  Hold (Positioning): Full assist, staff holds infant at breast  LATCH Score: 4   Lactation Tools Discussed/Used Tools: Pump Breast pump type: Double-Electric Breast Pump  Interventions Interventions: Adjust position;Assisted with latch;Support pillows;Skin to skin;Position options;Breast massage;Expressed milk;Breast compression;Pace feeding;DEBP  Discharge    Consult Status Consult Status: Follow-up Date: 06/26/22 Follow-up type: In-patient    Charyl Dancer 06/25/2022, 8:43 PM

## 2022-06-25 NOTE — Anesthesia Postprocedure Evaluation (Signed)
Anesthesia Post Note  Patient: Helen Dorsey  Procedure(s) Performed: AN AD HOC LABOR EPIDURAL     Patient location during evaluation: Mother Baby Anesthesia Type: Epidural Level of consciousness: awake and alert Pain management: pain level controlled Vital Signs Assessment: post-procedure vital signs reviewed and stable Respiratory status: spontaneous breathing, nonlabored ventilation and respiratory function stable Cardiovascular status: stable Postop Assessment: no headache, no backache and epidural receding Anesthetic complications: no   No notable events documented.  Last Vitals:  Vitals:   06/25/22 0802 06/25/22 0816  BP: 118/82 122/88  Pulse: 93 94  Resp: 18 18  Temp:    SpO2:      Last Pain:  Vitals:   06/25/22 0816  TempSrc:   PainSc: 0-No pain                 [name]

## 2022-06-25 NOTE — Progress Notes (Signed)
Helen Dorsey is a 31 y.o. G1P0 at [redacted]w[redacted]d admitted for induction of labor for GHTN and DM2  Subjective: Patient without complaints, comfortable with epidural.   Objective: BP (!) 136/94   Pulse 81   Temp 98.8 F (37.1 C) (Oral)   Resp 16   Ht 5\' 2"  (1.575 m)   Wt 105.5 kg   LMP 02/23/2021   SpO2 100%   BMI 42.54 kg/m  I/O last 3 completed shifts: In: -  Out: 900 [Urine:900] No intake/output data recorded.  FHT:  FHR: 160 bpm, variability: moderate,  accelerations:  Present,  decelerations:  Absent UC:   irregular, every 4 to 6  minutes SVE:   Dilation: 8 Effacement (%): 90 Station: Plus 1 Exam by:: [email or web address]  Labs: Lab Results  Component Value Date   WBC 6.3 06/24/2022   HGB 11.6 (L) 06/24/2022   HCT 33.9 (L) 06/24/2022   MCV 75.2 (L) 06/24/2022   PLT 320 06/24/2022    Assessment / Plan: Induction of labor due to gestational hypertension and DM2,  progressing well on pitocin  Labor: Progressing normally Preeclampsia:   None Fetal Wellbeing:  Category I Pain Control:  Epidural I/D:   Patient had two low grade fevers of 99.5 and 99.1 after taking acetaminophen. Baby's heart beat with base line 160 to 165. Will treat with Unasyn for impending intrapartum fever. She has been rupture for over 6 hours and is GBS positive.  Anticipated MOD:  NSVD Hypokalemia:  Recheck level and if low will replete.  08/24/2022, MD 06/25/2022, 00:40 AM

## 2022-06-26 LAB — BASIC METABOLIC PANEL
Anion gap: 10 (ref 5–15)
BUN: <5 mg/dL — ABNORMAL LOW (ref 6–20)
CO2: 22 mmol/L (ref 22–32)
Calcium: 8.9 mg/dL (ref 8.9–10.3)
Chloride: 108 mmol/L (ref 98–111)
Creatinine, Ser: 0.71 mg/dL (ref 0.44–1.00)
GFR, Estimated: >60 mL/min (ref >60)
Glucose, Bld: 75 mg/dL (ref 70–99)
Potassium: 3.4 mmol/L — ABNORMAL LOW (ref 3.5–5.1)
Sodium: 140 mmol/L (ref 135–145)

## 2022-06-26 LAB — GLUCOSE, CAPILLARY
Glucose-Capillary: 78 mg/dL (ref 70–99)
Glucose-Capillary: 105 mg/dL — ABNORMAL HIGH (ref 70–99)

## 2022-06-26 LAB — CBC
HCT: 27.6 % — ABNORMAL LOW (ref 36.0–46.0)
Hemoglobin: 9.8 g/dL — ABNORMAL LOW (ref 12.0–15.0)
MCH: 26.1 pg (ref 26.0–34.0)
MCHC: 35.5 g/dL (ref 30.0–36.0)
MCV: 73.6 fL — ABNORMAL LOW (ref 80.0–100.0)
Platelets: 255 10*3/uL (ref 150–400)
RBC: 3.75 MIL/uL — ABNORMAL LOW (ref 3.87–5.11)
RDW: 15.1 % (ref 11.5–15.5)
WBC: 11.4 10*3/uL — ABNORMAL HIGH (ref 4.0–10.5)
nRBC: 0 % (ref 0.0–0.2)

## 2022-06-26 MED ORDER — IBUPROFEN 600 MG PO TABS: 600.0000 mg | ORAL_TABLET | Freq: Four times a day (QID) | ORAL | 0 refills | Status: DC | PRN

## 2022-06-26 MED ORDER — ACETAMINOPHEN 500 MG PO TABS: 1000.0000 mg | ORAL_TABLET | Freq: Three times a day (TID) | ORAL | 0 refills | Status: DC | PRN

## 2022-06-26 NOTE — Plan of Care (Signed)
Discharge teaching given with after visit summary, pt receptive.

## 2022-06-26 NOTE — Lactation Note (Signed)
This note was copied from a baby's chart. Lactation Consultation Note  Patient Name: Helen Dorsey [id number] Date: 06/26/2022 Reason for consult: Follow-up assessment;Early term 37-38.6wks Age:33 hours  Mom was wanting assistance to get her milk to come "down." Mom has not pumped since yesterday (she pumped once, but didn't express anything) & infant is not latching at the breast. Mom has been using size 27 flanges, but needs size 21 flanges. I helped Mom pump with size 21 flanges. She was able to express about 1 mL and additional droplets were given to infant on a gloved finger. Infant had recently been fed with formula.   I recommended that she follow-up with our lactation clinic or at the [facility] office (they will soon have a part-time IBCLC).   I encouraged Mom to pump whenever infant gets formula, (except if she is sleeping). Infant has been receiving Neosure 22 cal. I asked parents to ask pediatrician if she would like infant to continue receiving 22 cal or switch to 20 cal. Parents verbalized understanding. Parents' questions were answered to their satisfaction.    Maternal Data Does the patient have breastfeeding experience prior to this delivery?: No  Feeding Mother's Current Feeding Choice: Breast Milk and Formula Nipple Type: Extra Slow Flow  Lactation Tools Discussed/Used Breast pump type: Double-Electric Breast Pump Pumped volume: 1 mL  Interventions Interventions: DEBP;Education  Discharge Discharge Education: Outpatient recommendation;Other (comment) (Mom is also aware of [employer]) Pump: Hands Free (Mom is aware she will need a size 21 insert for her Hands Free bra) WIC Program: Yes  Consult Status Consult Status: Complete    Remigio Eisenmenger 06/26/2022, 1:57 PM

## 2022-06-26 NOTE — Discharge Summary (Addendum)
Postpartum Discharge Summary  Date of Service updated 06/26/2022     Patient Name: Helen Dorsey DOB: August 12, 1991 MRN: [id number]  Date of admission: 06/24/2022 Delivery date:06/25/2022  Delivering provider: Shelda Pal  Date of discharge: 06/26/2022  Admitting diagnosis: Gestational hypertension [O13.9] Intrauterine pregnancy: [redacted]w[redacted]d    Secondary diagnosis:  Principal Problem:   Gestational hypertension  Additional problems: type 2 diabetes     Discharge diagnosis: Term Pregnancy Delivered, Gestational Hypertension, and Type 2 DM       ACUTE blood loss anemia no clinically signficant.                                        Post partum procedures: None Augmentation: AROM, Pitocin, and Cytotec Complications: None  Hospital course: Induction of Labor With Vaginal Delivery   31y.o. yo G1P1001 at 338w1das admitted to the hospital 06/24/2022 for induction of labor.  Indication for induction: Gestational hypertension.  Patient had an uncomplicated labor course as follows: Membrane Rupture Time/Date: 5:48 AM ,06/24/2022   Delivery Method:Vaginal, Spontaneous  Episiotomy: None  Lacerations:  1st degree  Details of delivery can be found in separate delivery note.  Patient had a routine postpartum course. Patient is discharged home 06/26/22.  Newborn Data: Birth date:06/25/2022  Birth time:6:27 AM  Gender:Female  Living status:Living  Apgars:6 ,9  Weight:3130 g   Magnesium Sulfate received: No BMZ received: No Rhophylac:N/A MMR:N/A T-DaP:Given prenatally Flu: N/A Transfusion:No  Physical exam  Vitals:   06/25/22 1043 06/25/22 1804 06/26/22 0630 06/26/22 0753  BP: (!) 139/97 129/84 (!) 135/96 104/79  Pulse: 85 78 93   Resp: _0 Temp: 98 F (36.7 C) 98 F (36.7 C) 98.4 F (36.9 C)   TempSrc: Oral Oral    SpO2:   100%   Weight:      Height:       General: alert, cooperative, and no distress Lochia: appropriate Uterine Fundus:  firm Incision: N/A DVT Evaluation: No evidence of DVT seen on physical exam. Labs: Lab Results  Component Value Date   WBC 11.4 (H) 06/26/2022   HGB 9.8 (L) 06/26/2022   HCT 27.6 (L) 06/26/2022   MCV 73.6 (L) 06/26/2022   PLT 255 06/26/2022      Latest Ref Rng & Units 06/26/2022    5:44 AM  CMP  Glucose 70 - 99 mg/dL 75   BUN 6 - 20 mg/dL <5   Creatinine 0.44 - 1.00 mg/dL 0.71   Sodium 135 - 145 mmol/L 140   Potassium 3.5 - 5.1 mmol/L 3.4   Chloride 98 - 111 mmol/L 108   CO2 22 - 32 mmol/L 22   Calcium 8.9 - 10.3 mg/dL 8.9    Edinburgh Score:    06/26/2022    8:10 AM  Edinburgh Postnatal Depression Scale Screening Tool  I have been able to laugh and see the funny side of things. 0  I have looked forward with enjoyment to things. 0  I have blamed myself unnecessarily when things went wrong. 0  I have been anxious or worried for no good reason. 0  I have felt scared or panicky for no good reason. 0  Things have been getting on top of me. 0  I have been so unhappy that I have had difficulty sleeping. 0  I have felt sad or miserable. 0  I  have been so unhappy that I have been crying. 0  The thought of harming myself has occurred to me. 0  Edinburgh Postnatal Depression Scale Total 0      After visit meds:  Allergies as of 06/26/2022   No Known Allergies      Medication List     STOP taking these medications    aspirin EC 81 MG tablet   insulin NPH Human 100 UNIT/ML injection Commonly known as: NOVOLIN N   insulin regular 100 units/mL injection Commonly known as: NOVOLIN R       TAKE these medications    acetaminophen 500 MG tablet Commonly known as: TYLENOL Take 2 tablets (1,000 mg total) by mouth every 8 (eight) hours as needed for fever.   ibuprofen 600 MG tablet Commonly known as: ADVIL Take 1 tablet (600 mg total) by mouth every 6 (six) hours as needed.   metFORMIN 500 MG tablet Commonly known as: GLUCOPHAGE Take by mouth 2 (two) times daily  with a meal.   NIFEdipine 30 MG 24 hr tablet Commonly known as: PROCARDIA-XL/NIFEDICAL-XL Take 30 mg by mouth daily.   Pen Needles 33G X 4 MM Misc 1 each by Does not apply route in the morning, at noon, in the evening, and at bedtime.   prenatal multivitamin Tabs tablet Take 1 tablet by mouth daily at 12 noon.         Discharge home in stable condition Infant Feeding: Bottle and Breast Infant Disposition:home with mother Discharge instruction: per After Visit Summary and Postpartum booklet. Activity: Advance as tolerated. Pelvic rest for 6 weeks.  Diet: carb modified diet Anticipated Birth Control: Unsure Postpartum Appointment:1 week Additional Postpartum F/U: BP check 1 week Future Appointments:No future appointments. Follow up Visit:  Follow-up Information     Christophe Louis, MD. Schedule an appointment as soon as possible for a visit in 1 week(s).   Specialty: Obstetrics and Gynecology Why: please schedule a nurse visit in 1 week for blood pressure check and an appointement in 6 weeks with Dr. Landry Mellow for postpartum visit Contact information: [address] [place] [address] [phone]                     06/26/2022 Christophe Louis, MD

## 2022-07-03 ENCOUNTER — Telehealth (HOSPITAL_COMMUNITY): Payer: Self-pay | Admitting: *Deleted

## 2022-07-03 NOTE — Telephone Encounter (Signed)
Patient voiced no questions or concerns regarding her health at this time. EPDS=0. Patient voiced no questions or concerns regarding infant at this time. Patient reports infant sleeps in a bassinet on her back. RN reviewed ABCs of safe sleep. Patient verbalized understanding. Patient requested RN email information on hospital's virtual postpartum classes and support groups. Email sent. Erline Levine, RN, [phone], 07/03/22

## 2022-08-22 LAB — PROINSULIN/INSULIN RATIO
Insulin: 104 u[IU]/mL — ABNORMAL HIGH
Proinsulin: 6 pmol/L

## 2022-10-13 NOTE — L&D Delivery Note (Signed)
[facility]N [facility] Delivery Note  Everlyn Gupta is a 32 y.o. G2P1001 s/p VD at [redacted]w[redacted]d. She was admitted for SOL.   ROM: 1h 28m with clear fluid GBS Status:  Negative/-- (09/06 0000) Maximum Maternal Temperature: 98  Labor Progress: Initial SVE: 6/70/-2. She then progressed to complete.   Delivery Date/Time: 07/01/2023 @1114  Delivery: Called to room and patient was complete and pushing. Head delivered LOA. No nuchal cord present. Left compound hand present release with posterior shoulder delivery. Anterior shoulder and body delivered in usual fashion. Infant with spontaneous cry, placed on mother's abdomen, dried and stimulated. Cord clamped x 2 after 1-minute delay, and cut by FOB. Cord blood drawn. Placenta delivered spontaneously with gentle cord traction. Fundus firm with massage and Pitocin. Labia, perineum, vagina, and cervix inspected. There were no lacerations.   Baby Weight: pending  Placenta: 3 vessel, intact. Sent to L&D Complications: Asynclitic presentation, delayed PPH Lacerations: None EBL: 87 mL initially, 977 following PPH. See separate note.  Analgesia: Epidural   Infant:  APGAR (1 MIN):  9 APGAR (5 MINS):  9  Brazen Domangue Autry-Lott, DO 07/01/2023, 11:38 AM

## 2022-10-16 DIAGNOSIS — J029 Acute pharyngitis, unspecified: Secondary | ICD-10-CM | POA: Diagnosis not present

## 2022-10-16 DIAGNOSIS — D500 Iron deficiency anemia secondary to blood loss (chronic): Secondary | ICD-10-CM | POA: Diagnosis not present

## 2022-10-16 DIAGNOSIS — Z8759 Personal history of other complications of pregnancy, childbirth and the puerperium: Secondary | ICD-10-CM | POA: Diagnosis not present

## 2022-10-16 DIAGNOSIS — Z20828 Contact with and (suspected) exposure to other viral communicable diseases: Secondary | ICD-10-CM | POA: Diagnosis not present

## 2022-10-16 DIAGNOSIS — E1169 Type 2 diabetes mellitus with other specified complication: Secondary | ICD-10-CM | POA: Diagnosis not present

## 2022-11-11 DIAGNOSIS — N898 Other specified noninflammatory disorders of vagina: Secondary | ICD-10-CM | POA: Diagnosis not present

## 2022-12-11 DIAGNOSIS — Z3480 Encounter for supervision of other normal pregnancy, unspecified trimester: Secondary | ICD-10-CM | POA: Diagnosis not present

## 2022-12-11 LAB — OB RESULTS CONSOLE HIV ANTIBODY (ROUTINE TESTING): HIV: NONREACTIVE

## 2022-12-11 LAB — OB RESULTS CONSOLE RUBELLA ANTIBODY, IGM: Rubella: IMMUNE

## 2022-12-11 LAB — OB RESULTS CONSOLE ABO/RH: RH Type: POSITIVE

## 2022-12-11 LAB — OB RESULTS CONSOLE HEPATITIS B SURFACE ANTIGEN: Hepatitis B Surface Ag: NEGATIVE

## 2022-12-11 LAB — HEPATITIS C ANTIBODY: HCV Ab: NEGATIVE

## 2022-12-24 ENCOUNTER — Other Ambulatory Visit (HOSPITAL_COMMUNITY)
Admission: RE | Admit: 2022-12-24 | Discharge: 2022-12-24 | Disposition: A | Discharge status: Home / Self Care | Payer: BC Managed Care – PPO | Source: Ambulatory Visit | Attending: Obstetrics and Gynecology | Admitting: Obstetrics and Gynecology

## 2022-12-24 ENCOUNTER — Other Ambulatory Visit: Payer: Self-pay | Admitting: Obstetrics and Gynecology

## 2022-12-24 DIAGNOSIS — O24111 Pre-existing diabetes mellitus, type 2, in pregnancy, first trimester: Secondary | ICD-10-CM | POA: Diagnosis not present

## 2022-12-24 DIAGNOSIS — Z1151 Encounter for screening for human papillomavirus (HPV): Secondary | ICD-10-CM | POA: Insufficient documentation

## 2022-12-24 DIAGNOSIS — Z3490 Encounter for supervision of normal pregnancy, unspecified, unspecified trimester: Secondary | ICD-10-CM | POA: Insufficient documentation

## 2022-12-24 DIAGNOSIS — Z3481 Encounter for supervision of other normal pregnancy, first trimester: Secondary | ICD-10-CM | POA: Diagnosis not present

## 2022-12-24 DIAGNOSIS — Z3480 Encounter for supervision of other normal pregnancy, unspecified trimester: Secondary | ICD-10-CM | POA: Diagnosis not present

## 2022-12-24 DIAGNOSIS — Z3A00 Weeks of gestation of pregnancy not specified: Secondary | ICD-10-CM | POA: Diagnosis not present

## 2022-12-24 LAB — OB RESULTS CONSOLE GC/CHLAMYDIA
Chlamydia: NEGATIVE
Neisseria Gonorrhea: NEGATIVE

## 2022-12-26 LAB — CYTOLOGY - PAP
Comment: NEGATIVE
Diagnosis: NEGATIVE
High risk HPV: NEGATIVE

## 2023-01-13 DIAGNOSIS — O24112 Pre-existing diabetes mellitus, type 2, in pregnancy, second trimester: Secondary | ICD-10-CM | POA: Diagnosis not present

## 2023-01-13 DIAGNOSIS — E119 Type 2 diabetes mellitus without complications: Secondary | ICD-10-CM | POA: Diagnosis not present

## 2023-02-17 ENCOUNTER — Encounter: Payer: Self-pay | Admitting: *Deleted

## 2023-02-17 ENCOUNTER — Other Ambulatory Visit: Payer: Self-pay | Admitting: Obstetrics and Gynecology

## 2023-02-17 DIAGNOSIS — O99210 Obesity complicating pregnancy, unspecified trimester: Secondary | ICD-10-CM | POA: Insufficient documentation

## 2023-02-17 DIAGNOSIS — O24119 Pre-existing diabetes mellitus, type 2, in pregnancy, unspecified trimester: Secondary | ICD-10-CM | POA: Insufficient documentation

## 2023-02-17 DIAGNOSIS — O24112 Pre-existing diabetes mellitus, type 2, in pregnancy, second trimester: Secondary | ICD-10-CM

## 2023-02-17 DIAGNOSIS — Z3A19 19 weeks gestation of pregnancy: Secondary | ICD-10-CM

## 2023-02-17 DIAGNOSIS — Z363 Encounter for antenatal screening for malformations: Secondary | ICD-10-CM

## 2023-02-17 DIAGNOSIS — Z8759 Personal history of other complications of pregnancy, childbirth and the puerperium: Secondary | ICD-10-CM | POA: Insufficient documentation

## 2023-02-18 ENCOUNTER — Ambulatory Visit: Payer: BC Managed Care – PPO | Attending: Obstetrics and Gynecology

## 2023-02-18 ENCOUNTER — Ambulatory Visit: Payer: BC Managed Care – PPO | Admitting: *Deleted

## 2023-02-18 ENCOUNTER — Encounter: Payer: Self-pay | Admitting: *Deleted

## 2023-02-18 ENCOUNTER — Other Ambulatory Visit: Payer: Self-pay | Admitting: *Deleted

## 2023-02-18 VITALS — BP 132/96 | HR 107

## 2023-02-18 DIAGNOSIS — O99212 Obesity complicating pregnancy, second trimester: Secondary | ICD-10-CM

## 2023-02-18 DIAGNOSIS — Z363 Encounter for antenatal screening for malformations: Secondary | ICD-10-CM | POA: Insufficient documentation

## 2023-02-18 DIAGNOSIS — Z362 Encounter for other antenatal screening follow-up: Secondary | ICD-10-CM

## 2023-02-18 DIAGNOSIS — Z8759 Personal history of other complications of pregnancy, childbirth and the puerperium: Secondary | ICD-10-CM | POA: Diagnosis not present

## 2023-02-18 DIAGNOSIS — O24112 Pre-existing diabetes mellitus, type 2, in pregnancy, second trimester: Secondary | ICD-10-CM

## 2023-02-18 DIAGNOSIS — O24119 Pre-existing diabetes mellitus, type 2, in pregnancy, unspecified trimester: Secondary | ICD-10-CM | POA: Diagnosis not present

## 2023-02-18 DIAGNOSIS — Z3A19 19 weeks gestation of pregnancy: Secondary | ICD-10-CM | POA: Diagnosis not present

## 2023-02-18 DIAGNOSIS — O99210 Obesity complicating pregnancy, unspecified trimester: Secondary | ICD-10-CM

## 2023-03-24 ENCOUNTER — Encounter: Payer: Self-pay | Admitting: *Deleted

## 2023-03-24 DIAGNOSIS — O09899 Supervision of other high risk pregnancies, unspecified trimester: Secondary | ICD-10-CM | POA: Insufficient documentation

## 2023-03-26 ENCOUNTER — Other Ambulatory Visit: Payer: Self-pay | Admitting: *Deleted

## 2023-03-26 ENCOUNTER — Ambulatory Visit: Payer: BC Managed Care – PPO | Attending: Maternal & Fetal Medicine

## 2023-03-26 DIAGNOSIS — E1110 Type 2 diabetes mellitus with ketoacidosis without coma: Secondary | ICD-10-CM

## 2023-03-26 DIAGNOSIS — Z3689 Encounter for other specified antenatal screening: Secondary | ICD-10-CM

## 2023-03-26 DIAGNOSIS — O24112 Pre-existing diabetes mellitus, type 2, in pregnancy, second trimester: Secondary | ICD-10-CM | POA: Insufficient documentation

## 2023-03-26 DIAGNOSIS — O99212 Obesity complicating pregnancy, second trimester: Secondary | ICD-10-CM | POA: Insufficient documentation

## 2023-03-26 DIAGNOSIS — Z362 Encounter for other antenatal screening follow-up: Secondary | ICD-10-CM | POA: Diagnosis not present

## 2023-03-26 DIAGNOSIS — Z7984 Long term (current) use of oral hypoglycemic drugs: Secondary | ICD-10-CM

## 2023-03-26 DIAGNOSIS — E669 Obesity, unspecified: Secondary | ICD-10-CM

## 2023-03-26 DIAGNOSIS — O09292 Supervision of pregnancy with other poor reproductive or obstetric history, second trimester: Secondary | ICD-10-CM

## 2023-03-26 DIAGNOSIS — O99282 Endocrine, nutritional and metabolic diseases complicating pregnancy, second trimester: Secondary | ICD-10-CM

## 2023-03-26 DIAGNOSIS — Z3A24 24 weeks gestation of pregnancy: Secondary | ICD-10-CM

## 2023-04-22 IMAGING — US US MFM OB LIMITED
1 series · 15 of 28 positions shown · non-contrast
Comparison: none

[Series 1: us mfm ob limited · 15 of 33 slices shown]
[im 1/33]
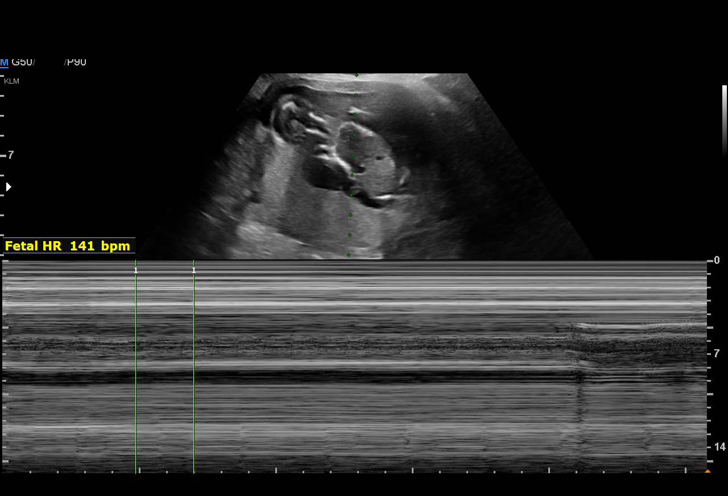
[im 3/33]
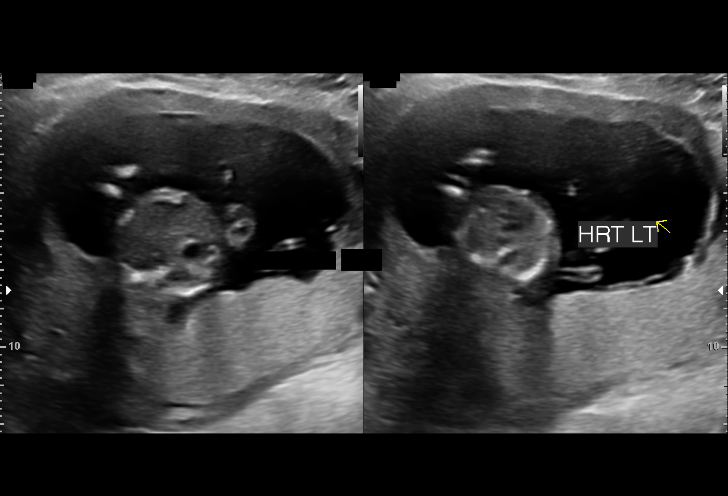
[im 5/33]
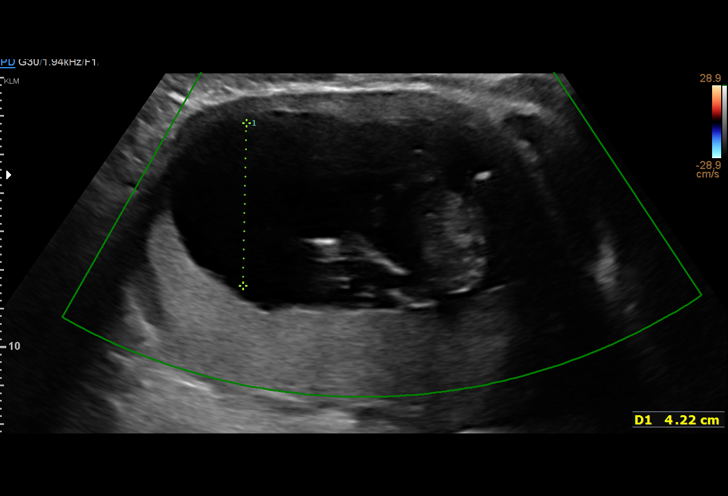
[im 8/33]
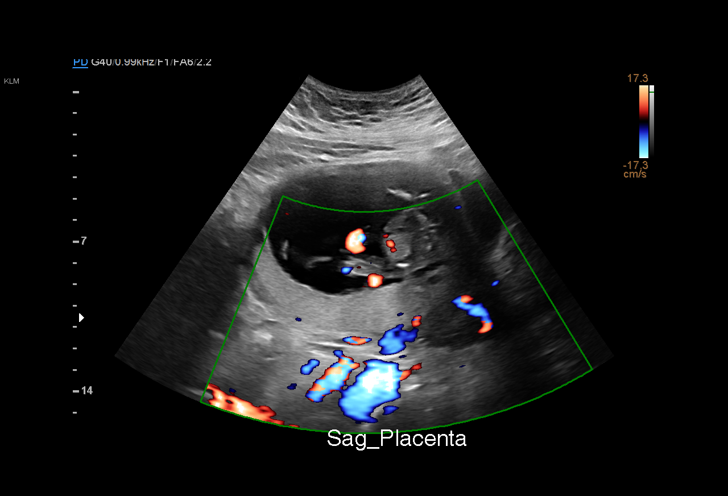
[im 10/33]
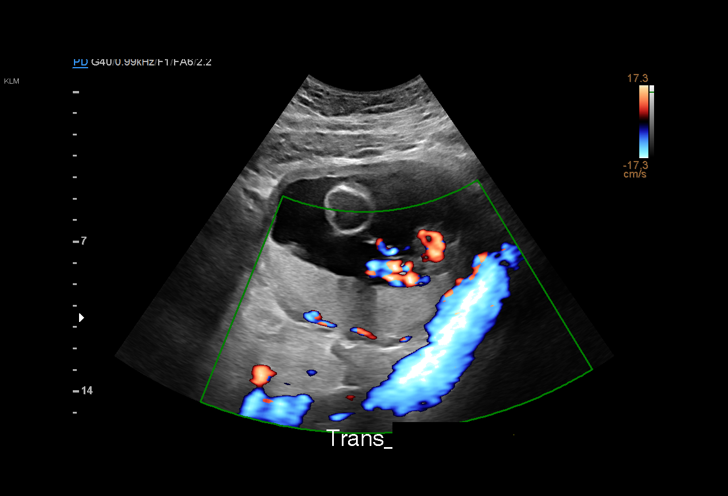
[im 12/33]
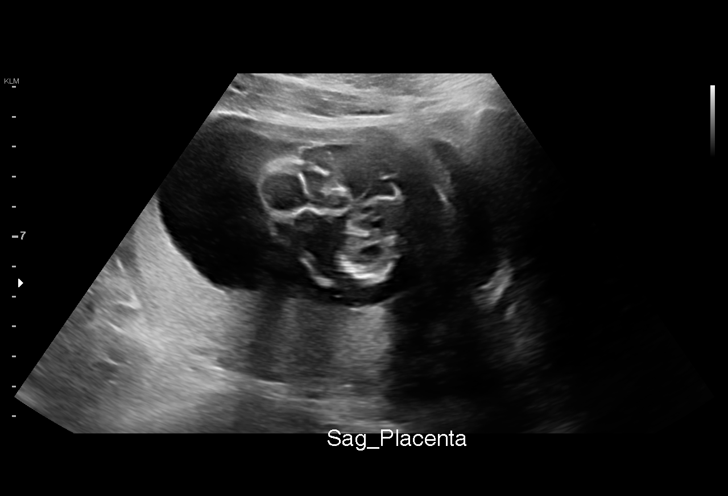
[im 15/33]
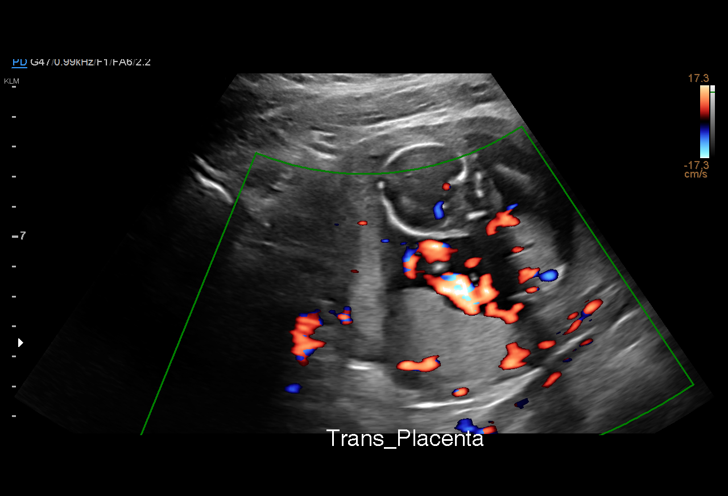
[im 17/33]
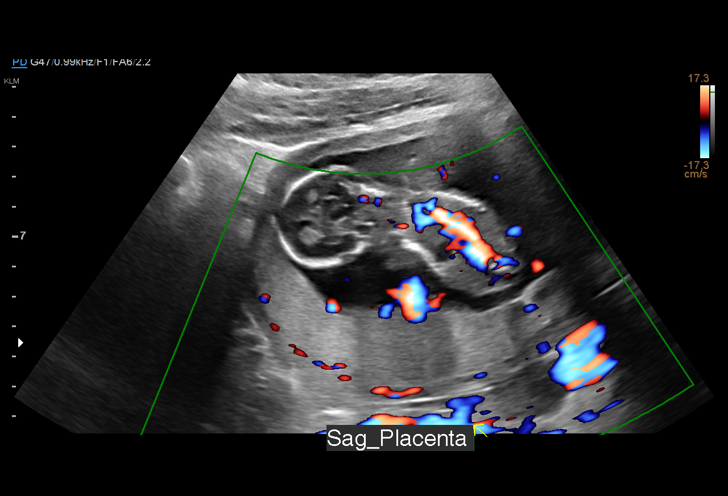
[im 18/33]
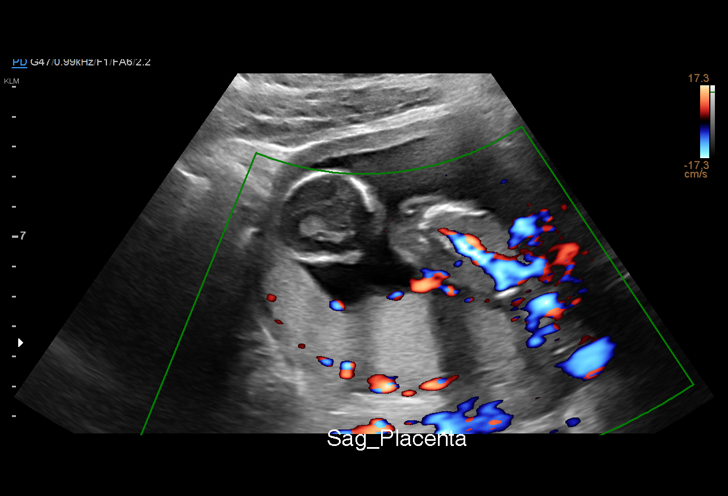
[im 21/33]
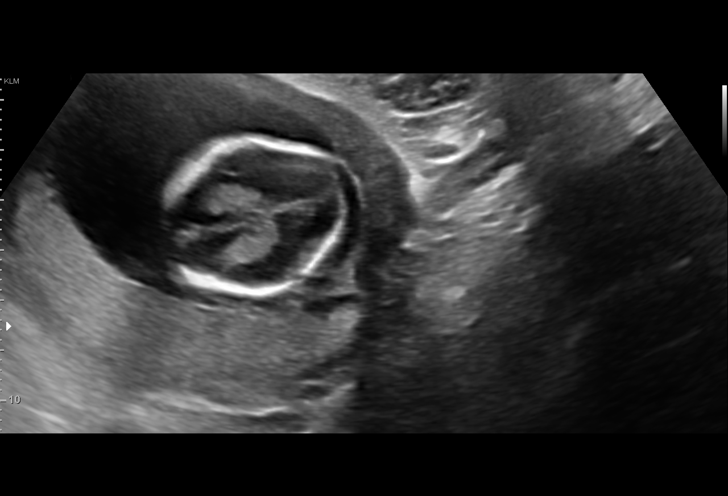
[im 23/33]
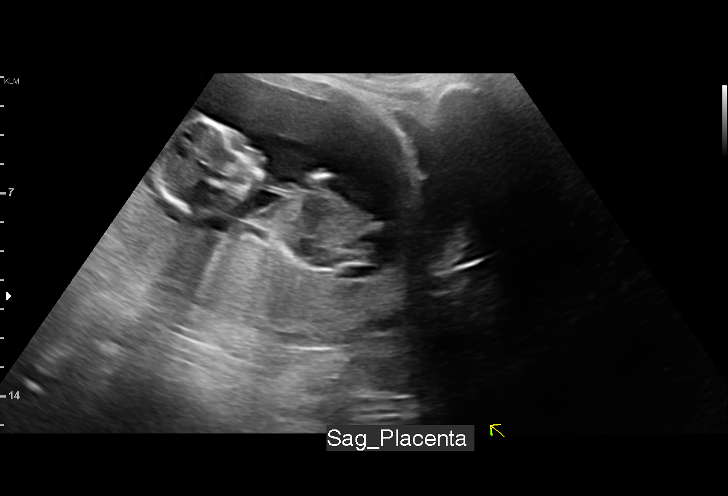
[im 25/33]
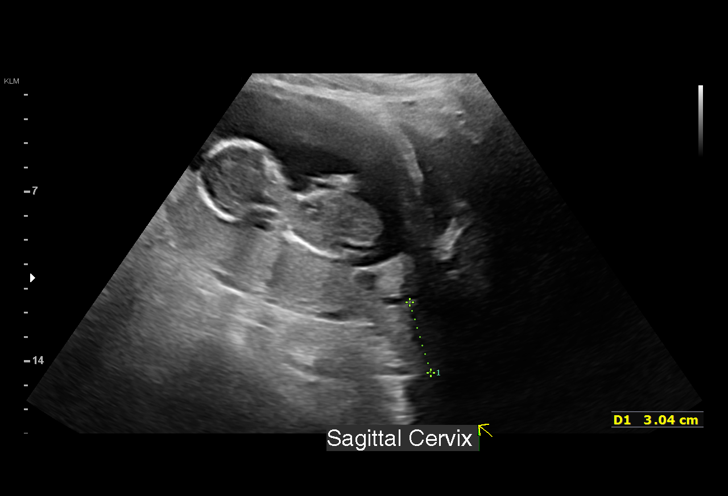
[im 28/33]
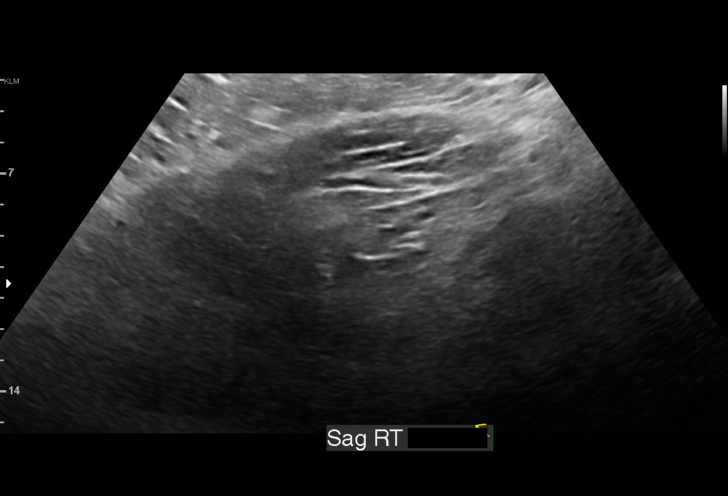
[im 30/33]
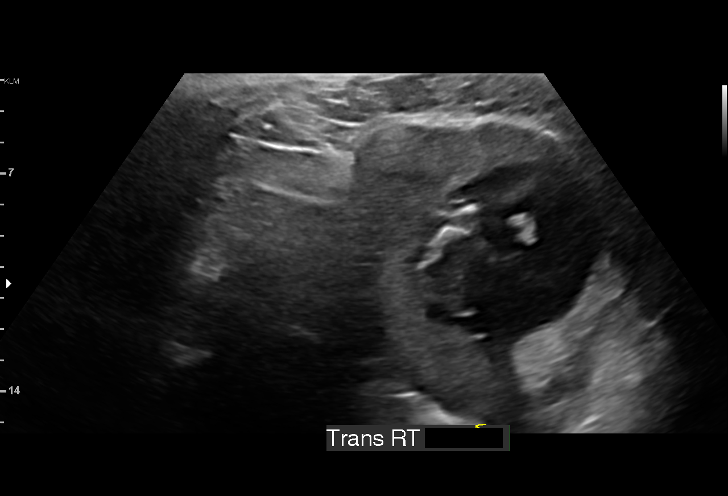
[im 33/33]
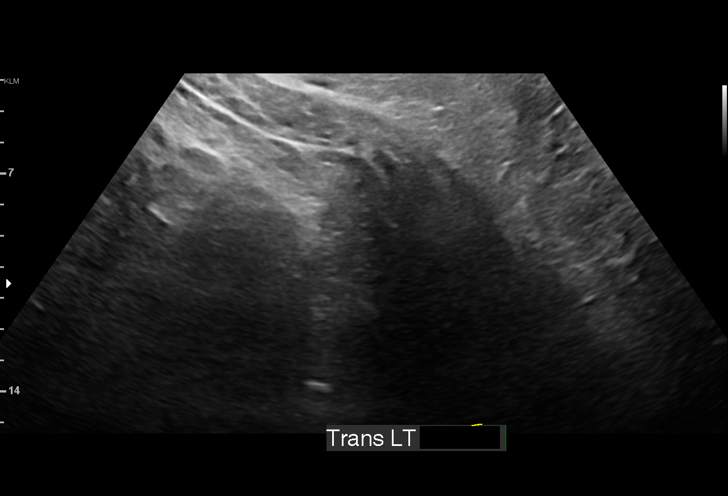

[15 of 28 positions shown; findings below may reference images not displayed]

[REDACTED]
                   TIGER CNM

Indications

 Vaginal bleeding in pregnancy, second
 trimester
 16 weeks gestation of pregnancy
Fetal Evaluation

 Num Of Fetuses:         1
 Fetal Heart Rate(bpm):  141
 Cardiac Activity:       Observed
 Presentation:           Breech
 Placenta:               Posterior Previa
 P. Cord Insertion:      Visualized

 Amniotic Fluid
 AFI FV:      Within normal limits

                             Largest Pocket(cm)


 Comment:    No placental abruption identified.
OB History

 Gravidity:    1         Term:   0        Prem:   0        SAB:   0
 TOP:          0       Ectopic:  0        Living: 0
Gestational Age

 Best:          16w 1d     Det. By:  Early Ultrasound         EDD:   07/15/22
Anatomy

 Cranium:               Appears normal         Stomach:                Appears normal, left
                                                                       sided
 Choroid Plexus:        Appears normal         Bladder:                Appears normal
Cervix Uterus Adnexa

 Cervix
 Length:              3  cm.
 Normal appearance by transabdominal scan.

 Uterus
 No abnormality visualized.

 Right Ovary
 Not visualized.

 Left Ovary
 Not visualized.

 Adnexa
 No abnormality visualized.
Impression

 Limited exam due to maternal vaginal bleeding.
 Good fetal movement and amniotic fluid
 Posterior placenta previa observed.
Recommendations

 Clinical correlation recommend
 Detailed exam at 18-20 weeks.

## 2023-04-24 DIAGNOSIS — O24113 Pre-existing diabetes mellitus, type 2, in pregnancy, third trimester: Secondary | ICD-10-CM | POA: Diagnosis not present

## 2023-04-24 DIAGNOSIS — Z3A28 28 weeks gestation of pregnancy: Secondary | ICD-10-CM | POA: Diagnosis not present

## 2023-04-24 DIAGNOSIS — O24913 Unspecified diabetes mellitus in pregnancy, third trimester: Secondary | ICD-10-CM | POA: Diagnosis not present

## 2023-05-01 ENCOUNTER — Ambulatory Visit: Payer: BC Managed Care – PPO | Attending: Maternal & Fetal Medicine

## 2023-05-01 ENCOUNTER — Other Ambulatory Visit: Payer: Self-pay | Admitting: *Deleted

## 2023-05-01 DIAGNOSIS — Z7984 Long term (current) use of oral hypoglycemic drugs: Secondary | ICD-10-CM | POA: Diagnosis not present

## 2023-05-01 DIAGNOSIS — O24113 Pre-existing diabetes mellitus, type 2, in pregnancy, third trimester: Secondary | ICD-10-CM | POA: Diagnosis not present

## 2023-05-01 DIAGNOSIS — Z3689 Encounter for other specified antenatal screening: Secondary | ICD-10-CM

## 2023-05-01 DIAGNOSIS — O24112 Pre-existing diabetes mellitus, type 2, in pregnancy, second trimester: Secondary | ICD-10-CM | POA: Insufficient documentation

## 2023-05-01 DIAGNOSIS — O09293 Supervision of pregnancy with other poor reproductive or obstetric history, third trimester: Secondary | ICD-10-CM

## 2023-05-01 DIAGNOSIS — E669 Obesity, unspecified: Secondary | ICD-10-CM

## 2023-05-01 DIAGNOSIS — Z8759 Personal history of other complications of pregnancy, childbirth and the puerperium: Secondary | ICD-10-CM

## 2023-05-01 DIAGNOSIS — O99212 Obesity complicating pregnancy, second trimester: Secondary | ICD-10-CM | POA: Diagnosis not present

## 2023-05-01 DIAGNOSIS — E1169 Type 2 diabetes mellitus with other specified complication: Secondary | ICD-10-CM

## 2023-05-01 DIAGNOSIS — O99213 Obesity complicating pregnancy, third trimester: Secondary | ICD-10-CM

## 2023-05-01 DIAGNOSIS — Z3A30 30 weeks gestation of pregnancy: Secondary | ICD-10-CM

## 2023-05-06 DIAGNOSIS — Z3483 Encounter for supervision of other normal pregnancy, third trimester: Secondary | ICD-10-CM | POA: Diagnosis not present

## 2023-05-06 DIAGNOSIS — Z3490 Encounter for supervision of normal pregnancy, unspecified, unspecified trimester: Secondary | ICD-10-CM | POA: Diagnosis not present

## 2023-05-06 LAB — OB RESULTS CONSOLE RPR: RPR: NONREACTIVE

## 2023-05-16 IMAGING — US US MFM OB LIMITED
1 series · 14 of 28 positions shown · non-contrast
Comparison: none

[Series 1: us mfm ob limited · 53 acquisitions, 14 frames shown]
[im 2/53]
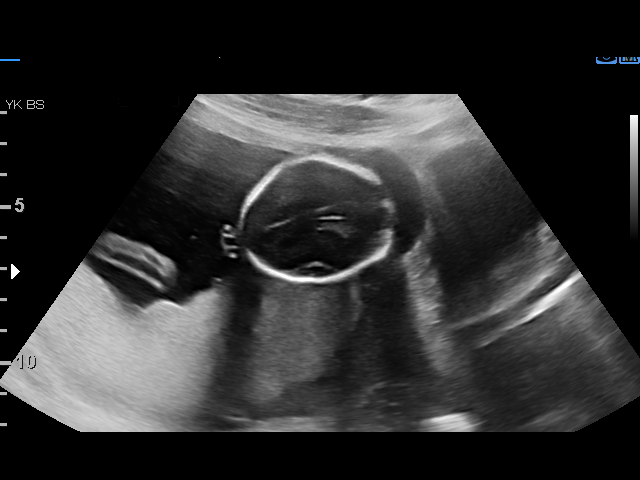
[im 6/53]
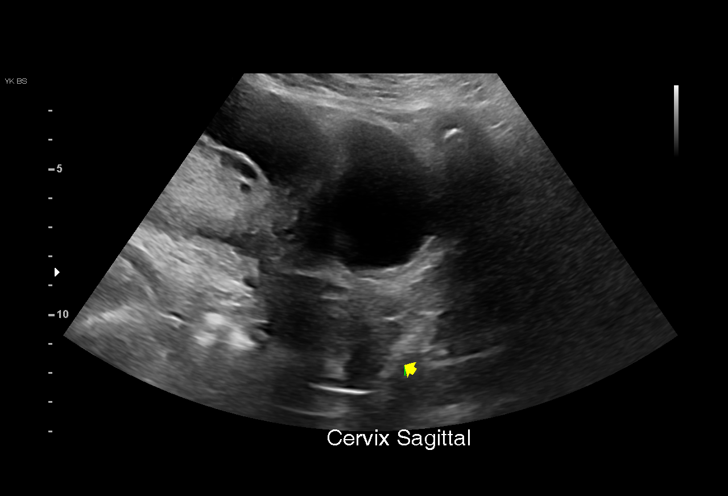
[im 10/53]
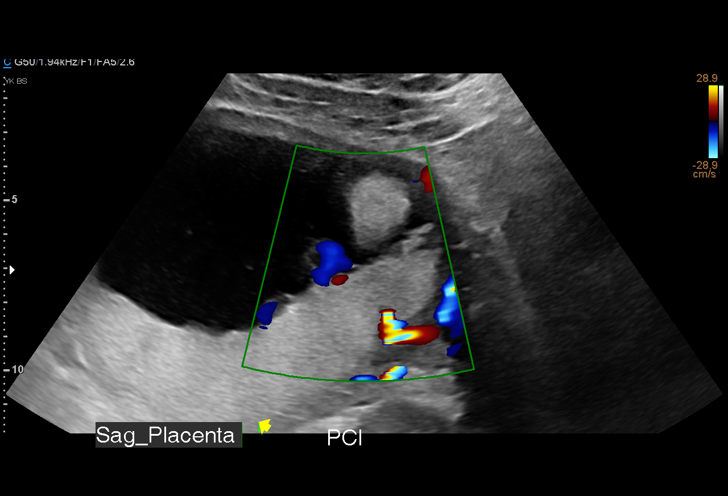
[im 14/53]
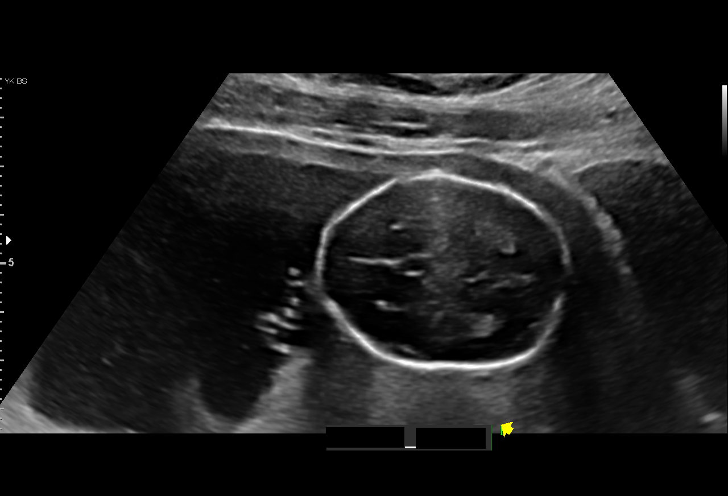
[im 18/53]
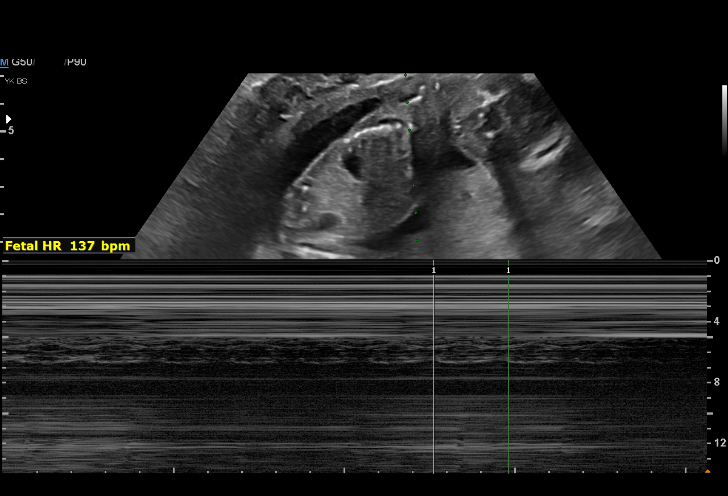
[im 22/53]
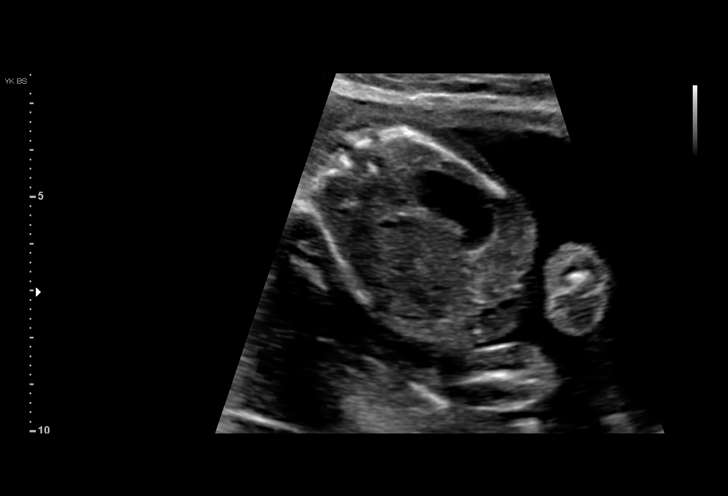
[im 26/53]
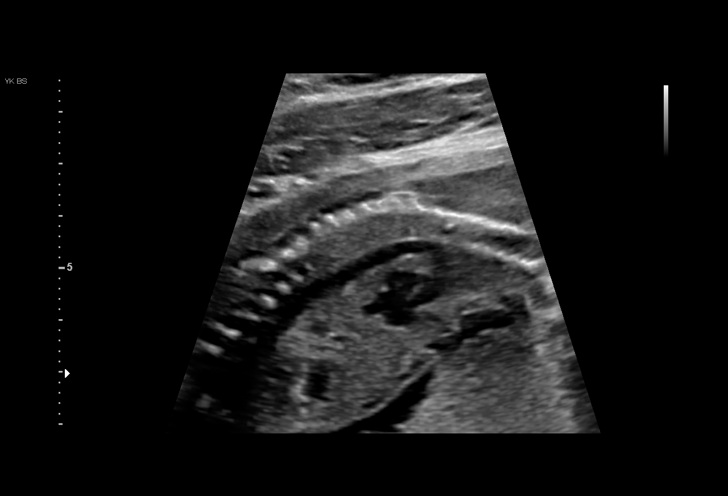
[im 29/53]
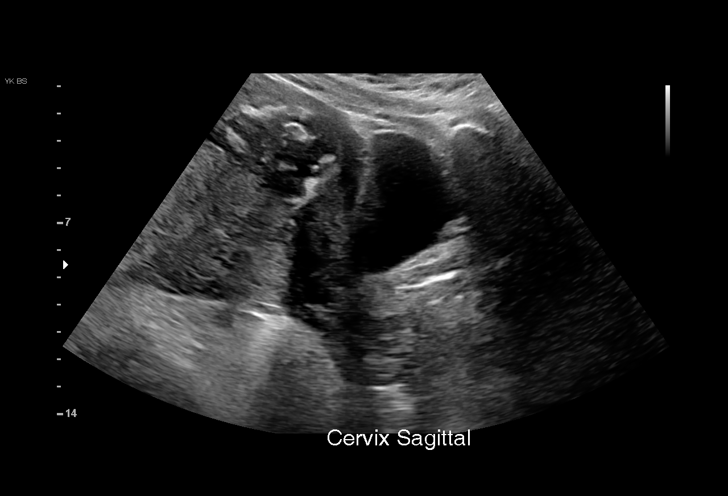
[im 33/53]
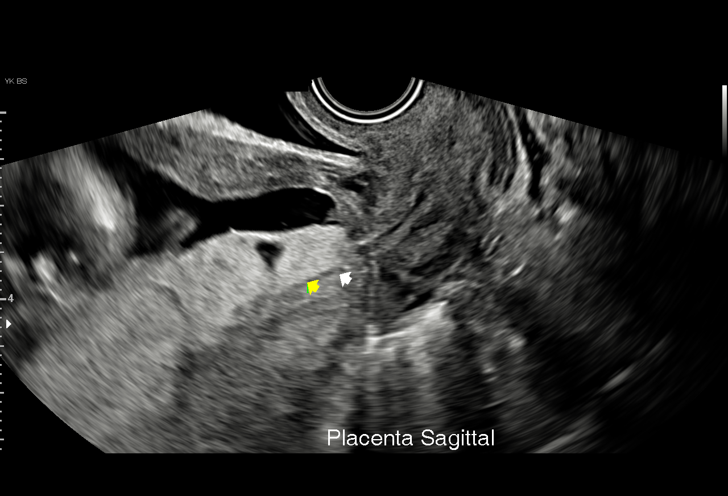
[im 37/53]
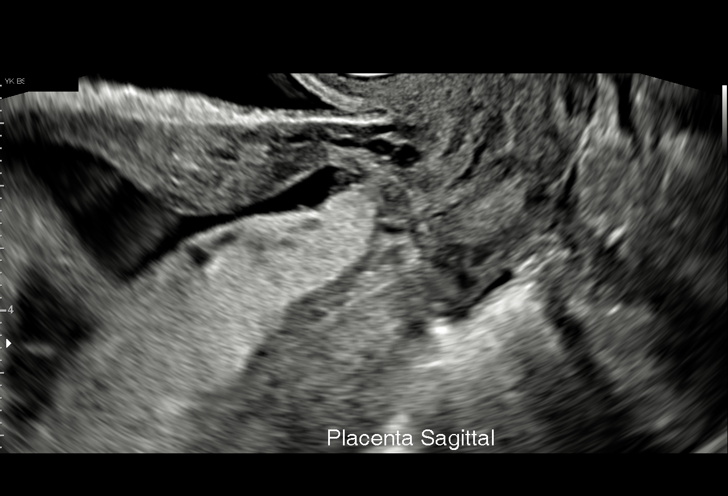
[im 41/53]
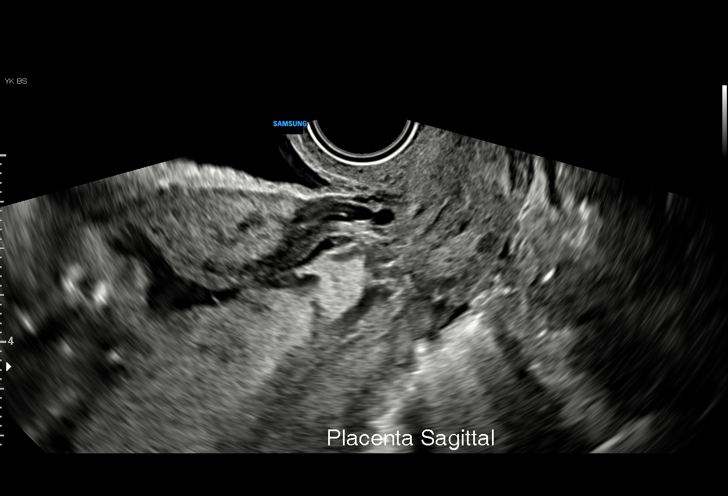
[im 45/53]
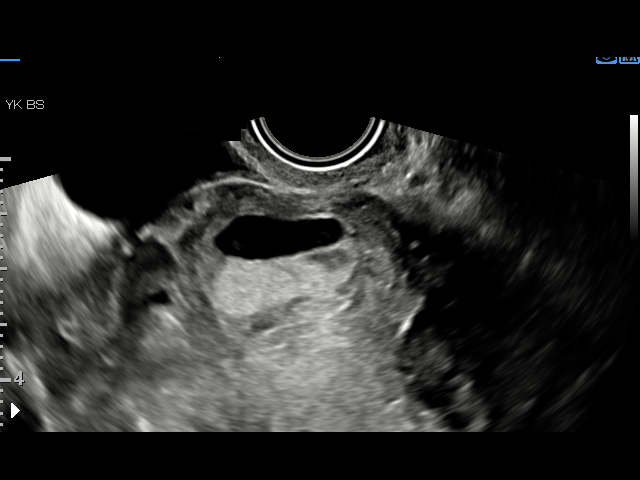
[im 49/53]
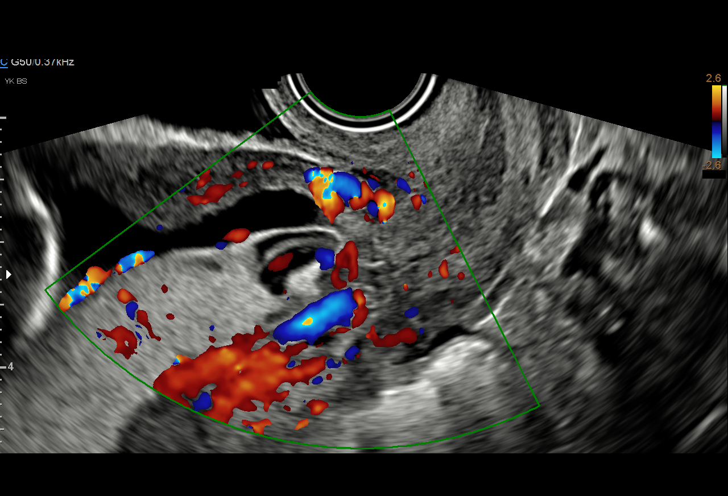
[im 53/53]
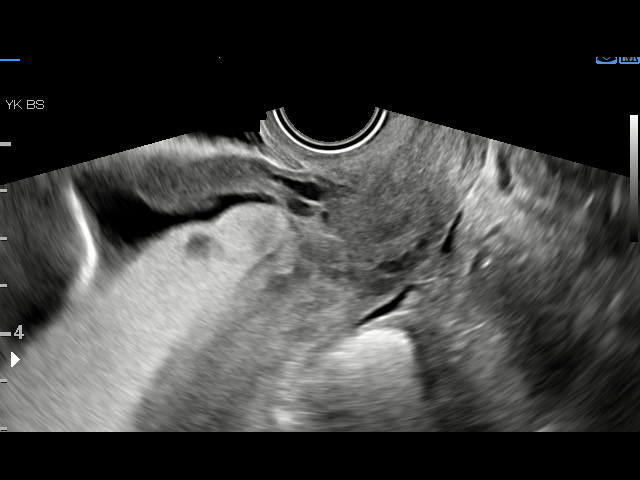

[14 of 28 positions shown; findings below may reference images not displayed]

Attending:        Trini Valeria        Secondary Phy.:    [facility] MAU/Triage
                                                             BALAJI CNM
                   ISENS CNM
 Ref. Address:     [HOSPITAL]       [facility]
                   [REDACTED]
                   [address]

 1  US MFM OB LIMITED                     76815.01    [facility]
 2  US MFM OB TRANSVAGINAL                76817.2     [facility]

Indications

 Vaginal bleeding in pregnancy, second
 trimester
 Placental abnormality complicating
 pregnancy, second trimester
 Encounter for cervical length
 Obesity complicating pregnancy, second
 trimester
 19 weeks gestation of pregnancy
Fetal Evaluation

 Num Of Fetuses:          1
 Fetal Heart Rate(bpm):   137
 Cardiac Activity:        Observed
 Presentation:            Variable
 Placenta:                Posterior Previa
 P. Cord Insertion:       Visualized, central

 Amniotic Fluid
 AFI FV:      Within normal limits

                             Largest Pocket(cm)

OB History

 Gravidity:    1         Term:   0        Prem:   0        SAB:   0
 TOP:          0       Ectopic:  0        Living: 0
Gestational Age

 Best:          19w 4d     Det. By:  Early Ultrasound         EDD:   07/15/22
Anatomy

 Cranium:               Appears normal         Stomach:                Appears normal, left
                                                                       sided
 Cavum:                 Appears normal         Abdominal Wall:         Appears nml (cord
                                                                       insert, abd wall)
 Ventricles:            Appears normal         Cord Vessels:           Appears normal (3
                                                                       vessel cord)
 Choroid Plexus:        Appears normal         Bladder:                Appears normal
Cervix Uterus Adnexa

 Cervix
 Length:            3.6  cm.

 Uterus
 No abnormality visualized.

 Right Ovary
 Within normal limits.

 Left Ovary
 Within normal limits.

 Cul De Sac
 No free fluid seen.
Impression

 Patient was evaluated for c/o vaginal bleeding .

 A limited ultrasound study was performed .Amniotic fluid is
 normal and good fetal activity is seen . We performed
 transvaginl ultrasound. Posterior placental edge is seen
 covering the internal os (Placenta Previa).
 No evidence of retroplacental hemorrhage.
 On transabdominal scan, the cervix looks long and closed
 (3.6 cm).
Recommendations

 -Patient has an appointment for fetal anatomy scan on
 03/18/22 at [facility].
                Van, Mafe

## 2023-05-21 ENCOUNTER — Ambulatory Visit: Payer: BC Managed Care – PPO | Attending: Obstetrics and Gynecology

## 2023-05-21 ENCOUNTER — Ambulatory Visit: Payer: BC Managed Care – PPO

## 2023-05-21 VITALS — BP 125/88 | HR 98

## 2023-05-21 DIAGNOSIS — O99213 Obesity complicating pregnancy, third trimester: Secondary | ICD-10-CM | POA: Insufficient documentation

## 2023-05-21 DIAGNOSIS — E1110 Type 2 diabetes mellitus with ketoacidosis without coma: Secondary | ICD-10-CM | POA: Diagnosis not present

## 2023-05-21 DIAGNOSIS — Z8759 Personal history of other complications of pregnancy, childbirth and the puerperium: Secondary | ICD-10-CM | POA: Diagnosis not present

## 2023-05-21 DIAGNOSIS — Z3A32 32 weeks gestation of pregnancy: Secondary | ICD-10-CM

## 2023-05-21 DIAGNOSIS — Z3689 Encounter for other specified antenatal screening: Secondary | ICD-10-CM | POA: Diagnosis not present

## 2023-05-21 DIAGNOSIS — O09293 Supervision of pregnancy with other poor reproductive or obstetric history, third trimester: Secondary | ICD-10-CM

## 2023-05-21 DIAGNOSIS — O24113 Pre-existing diabetes mellitus, type 2, in pregnancy, third trimester: Secondary | ICD-10-CM | POA: Insufficient documentation

## 2023-05-21 DIAGNOSIS — Z8632 Personal history of gestational diabetes: Secondary | ICD-10-CM

## 2023-05-21 DIAGNOSIS — Z7984 Long term (current) use of oral hypoglycemic drugs: Secondary | ICD-10-CM

## 2023-05-21 DIAGNOSIS — E669 Obesity, unspecified: Secondary | ICD-10-CM

## 2023-05-22 ENCOUNTER — Ambulatory Visit: Payer: BC Managed Care – PPO

## 2023-05-25 DIAGNOSIS — O24113 Pre-existing diabetes mellitus, type 2, in pregnancy, third trimester: Secondary | ICD-10-CM | POA: Diagnosis not present

## 2023-05-29 ENCOUNTER — Ambulatory Visit: Payer: BC Managed Care – PPO | Admitting: *Deleted

## 2023-05-29 ENCOUNTER — Ambulatory Visit: Payer: BC Managed Care – PPO | Attending: Obstetrics and Gynecology

## 2023-05-29 VITALS — BP 130/77 | HR 118

## 2023-05-29 DIAGNOSIS — E669 Obesity, unspecified: Secondary | ICD-10-CM | POA: Diagnosis not present

## 2023-05-29 DIAGNOSIS — O24113 Pre-existing diabetes mellitus, type 2, in pregnancy, third trimester: Secondary | ICD-10-CM

## 2023-05-29 DIAGNOSIS — Z8759 Personal history of other complications of pregnancy, childbirth and the puerperium: Secondary | ICD-10-CM | POA: Diagnosis not present

## 2023-05-29 DIAGNOSIS — O09293 Supervision of pregnancy with other poor reproductive or obstetric history, third trimester: Secondary | ICD-10-CM

## 2023-05-29 DIAGNOSIS — E119 Type 2 diabetes mellitus without complications: Secondary | ICD-10-CM | POA: Diagnosis not present

## 2023-05-29 DIAGNOSIS — O99213 Obesity complicating pregnancy, third trimester: Secondary | ICD-10-CM | POA: Insufficient documentation

## 2023-05-29 DIAGNOSIS — Z3A33 33 weeks gestation of pregnancy: Secondary | ICD-10-CM

## 2023-05-29 DIAGNOSIS — Z3689 Encounter for other specified antenatal screening: Secondary | ICD-10-CM | POA: Diagnosis not present

## 2023-06-01 DIAGNOSIS — R300 Dysuria: Secondary | ICD-10-CM | POA: Diagnosis not present

## 2023-06-05 ENCOUNTER — Ambulatory Visit: Payer: BC Managed Care – PPO | Admitting: *Deleted

## 2023-06-05 ENCOUNTER — Ambulatory Visit: Payer: BC Managed Care – PPO | Attending: Obstetrics and Gynecology

## 2023-06-05 VITALS — BP 119/87 | HR 101

## 2023-06-05 DIAGNOSIS — Z3689 Encounter for other specified antenatal screening: Secondary | ICD-10-CM | POA: Diagnosis not present

## 2023-06-05 DIAGNOSIS — O24113 Pre-existing diabetes mellitus, type 2, in pregnancy, third trimester: Secondary | ICD-10-CM | POA: Insufficient documentation

## 2023-06-05 DIAGNOSIS — Z8759 Personal history of other complications of pregnancy, childbirth and the puerperium: Secondary | ICD-10-CM | POA: Insufficient documentation

## 2023-06-05 DIAGNOSIS — E669 Obesity, unspecified: Secondary | ICD-10-CM

## 2023-06-05 DIAGNOSIS — E1169 Type 2 diabetes mellitus with other specified complication: Secondary | ICD-10-CM | POA: Diagnosis not present

## 2023-06-05 DIAGNOSIS — O99213 Obesity complicating pregnancy, third trimester: Secondary | ICD-10-CM | POA: Insufficient documentation

## 2023-06-05 DIAGNOSIS — O09293 Supervision of pregnancy with other poor reproductive or obstetric history, third trimester: Secondary | ICD-10-CM

## 2023-06-05 DIAGNOSIS — Z3A34 34 weeks gestation of pregnancy: Secondary | ICD-10-CM

## 2023-06-12 ENCOUNTER — Ambulatory Visit: Payer: BC Managed Care – PPO | Attending: Obstetrics and Gynecology

## 2023-06-12 ENCOUNTER — Ambulatory Visit: Payer: BC Managed Care – PPO | Admitting: *Deleted

## 2023-06-12 VITALS — BP 118/82 | HR 89

## 2023-06-12 DIAGNOSIS — O99213 Obesity complicating pregnancy, third trimester: Secondary | ICD-10-CM | POA: Insufficient documentation

## 2023-06-12 DIAGNOSIS — O09293 Supervision of pregnancy with other poor reproductive or obstetric history, third trimester: Secondary | ICD-10-CM

## 2023-06-12 DIAGNOSIS — Z8759 Personal history of other complications of pregnancy, childbirth and the puerperium: Secondary | ICD-10-CM | POA: Insufficient documentation

## 2023-06-12 DIAGNOSIS — Z794 Long term (current) use of insulin: Secondary | ICD-10-CM | POA: Diagnosis not present

## 2023-06-12 DIAGNOSIS — E1169 Type 2 diabetes mellitus with other specified complication: Secondary | ICD-10-CM | POA: Diagnosis not present

## 2023-06-12 DIAGNOSIS — Z3689 Encounter for other specified antenatal screening: Secondary | ICD-10-CM | POA: Diagnosis not present

## 2023-06-12 DIAGNOSIS — E669 Obesity, unspecified: Secondary | ICD-10-CM

## 2023-06-12 DIAGNOSIS — O24113 Pre-existing diabetes mellitus, type 2, in pregnancy, third trimester: Secondary | ICD-10-CM | POA: Diagnosis not present

## 2023-06-12 DIAGNOSIS — Z3A35 35 weeks gestation of pregnancy: Secondary | ICD-10-CM

## 2023-06-19 ENCOUNTER — Ambulatory Visit: Payer: BC Managed Care – PPO | Admitting: *Deleted

## 2023-06-19 ENCOUNTER — Ambulatory Visit: Payer: BC Managed Care – PPO | Attending: Obstetrics and Gynecology

## 2023-06-19 DIAGNOSIS — O09293 Supervision of pregnancy with other poor reproductive or obstetric history, third trimester: Secondary | ICD-10-CM

## 2023-06-19 DIAGNOSIS — Z8759 Personal history of other complications of pregnancy, childbirth and the puerperium: Secondary | ICD-10-CM | POA: Diagnosis not present

## 2023-06-19 DIAGNOSIS — Z794 Long term (current) use of insulin: Secondary | ICD-10-CM

## 2023-06-19 DIAGNOSIS — O99213 Obesity complicating pregnancy, third trimester: Secondary | ICD-10-CM | POA: Insufficient documentation

## 2023-06-19 DIAGNOSIS — O24113 Pre-existing diabetes mellitus, type 2, in pregnancy, third trimester: Secondary | ICD-10-CM | POA: Insufficient documentation

## 2023-06-19 DIAGNOSIS — Z3689 Encounter for other specified antenatal screening: Secondary | ICD-10-CM | POA: Diagnosis not present

## 2023-06-19 DIAGNOSIS — E669 Obesity, unspecified: Secondary | ICD-10-CM

## 2023-06-19 DIAGNOSIS — E1169 Type 2 diabetes mellitus with other specified complication: Secondary | ICD-10-CM | POA: Diagnosis not present

## 2023-06-19 DIAGNOSIS — Z3A36 36 weeks gestation of pregnancy: Secondary | ICD-10-CM

## 2023-06-19 DIAGNOSIS — Z3490 Encounter for supervision of normal pregnancy, unspecified, unspecified trimester: Secondary | ICD-10-CM | POA: Diagnosis not present

## 2023-06-19 LAB — OB RESULTS CONSOLE GBS: GBS: NEGATIVE

## 2023-06-22 ENCOUNTER — Other Ambulatory Visit: Payer: Self-pay | Admitting: *Deleted

## 2023-06-22 DIAGNOSIS — O24913 Unspecified diabetes mellitus in pregnancy, third trimester: Secondary | ICD-10-CM

## 2023-06-25 ENCOUNTER — Ambulatory Visit: Payer: BC Managed Care – PPO | Attending: Obstetrics

## 2023-06-25 DIAGNOSIS — Z3A37 37 weeks gestation of pregnancy: Secondary | ICD-10-CM

## 2023-06-25 DIAGNOSIS — E1169 Type 2 diabetes mellitus with other specified complication: Secondary | ICD-10-CM

## 2023-06-25 DIAGNOSIS — O24913 Unspecified diabetes mellitus in pregnancy, third trimester: Secondary | ICD-10-CM | POA: Insufficient documentation

## 2023-06-25 DIAGNOSIS — Z794 Long term (current) use of insulin: Secondary | ICD-10-CM | POA: Diagnosis not present

## 2023-06-25 DIAGNOSIS — O99213 Obesity complicating pregnancy, third trimester: Secondary | ICD-10-CM | POA: Diagnosis not present

## 2023-06-25 DIAGNOSIS — E669 Obesity, unspecified: Secondary | ICD-10-CM

## 2023-06-25 DIAGNOSIS — O24113 Pre-existing diabetes mellitus, type 2, in pregnancy, third trimester: Secondary | ICD-10-CM

## 2023-06-25 DIAGNOSIS — O09293 Supervision of pregnancy with other poor reproductive or obstetric history, third trimester: Secondary | ICD-10-CM

## 2023-06-26 ENCOUNTER — Other Ambulatory Visit: Payer: Self-pay | Admitting: Obstetrics and Gynecology

## 2023-06-26 ENCOUNTER — Ambulatory Visit: Payer: BC Managed Care – PPO

## 2023-06-28 ENCOUNTER — Other Ambulatory Visit: Payer: Self-pay | Admitting: Obstetrics and Gynecology

## 2023-06-28 DIAGNOSIS — O24113 Pre-existing diabetes mellitus, type 2, in pregnancy, third trimester: Secondary | ICD-10-CM

## 2023-06-28 NOTE — H&P (Signed)
Helen Dorsey is a 32 y.o. female G2P1001 at 39 weeks and 2 days presenting for induction of labor due to type 2 diabetes. Pt is on the following insulin regimen  48 units of NPH in AM and 26 units of regular in AM . .. she is on 22 units of NPH in pm  and 20 units regular in pm. Blood sugars are well controlled on this regimen.   She has a history of gestational  hypertension with previous pregnancy. She has been on 81 mg of aspirin daily.   Prenatal care provided by Dr. Gerald Leitz with [facility]  Past Medical History:  Diagnosis Date   Diabetes mellitus without complication (HCC)    DKA (diabetic ketoacidosis) (HCC) 11/08/2020   Pregnancy induced hypertension    Past Surgical History:  Procedure Laterality Date   NO PAST SURGERIES     Family History: family history includes Cancer in her maternal grandmother; Diabetes in her brother and sister; Heart disease in her mother; Hypertension in her mother. Social History:  reports that she quit smoking about 19 months ago. Her smoking use included cigarettes. She has never used smokeless tobacco. She reports that she does not currently use alcohol. She reports that she does not use drugs.     Maternal Diabetes: Yes:  Diabetes Type:  Pre-pregnancy Genetic Screening: Declined Maternal Ultrasounds/Referrals: Normal Fetal Ultrasounds or other Referrals:  Fetal echo Maternal Substance Abuse:  No Significant Maternal Medications:  Meds include: Other:  Insulin  Significant Maternal Lab Results:  Group B Strep negative Number of Prenatal Visits:greater than 3 verified prenatal visits Other Comments:  None  Review of Systems  Constitutional: Negative.   HENT: Negative.    Eyes: Negative.   Respiratory: Negative.    Cardiovascular: Negative.   Gastrointestinal: Negative.   Endocrine: Negative.   Genitourinary: Negative.   Musculoskeletal: Negative.   Skin: Negative.   Allergic/Immunologic: Negative.   Neurological:  Negative.   Hematological: Negative.   Psychiatric/Behavioral: Negative.     History   Last menstrual period 10/04/2022, unknown if currently breastfeeding. Maternal Exam:  Introitus: Normal vulva.   Physical Exam Vitals reviewed.  Constitutional:      Appearance: Normal appearance.  HENT:     Head: Normocephalic and atraumatic.     Nose: Nose normal.     Mouth/Throat:     Mouth: Mucous membranes are moist.  Cardiovascular:     Rate and Rhythm: Normal rate and regular rhythm.     Pulses: Normal pulses.     Heart sounds: Normal heart sounds.  Pulmonary:     Effort: Pulmonary effort is normal.     Breath sounds: Normal breath sounds.  Abdominal:     Tenderness: There is no abdominal tenderness.  Genitourinary:    General: Normal vulva.  Musculoskeletal:        General: Normal range of motion.     Cervical back: Normal range of motion and neck supple.  Skin:    General: Skin is warm and dry.  Neurological:     General: No focal deficit present.     Mental Status: She is alert and oriented to person, place, and time.  Psychiatric:        Mood and Affect: Mood normal.        Behavior: Behavior normal.    Cervix closed / thick/ high   Prenatal labs: ABO, Rh:  O positive  Antibody:  Negative  Rubella:  Immune  RPR:   Negative  HBsAg:   Negative  HIV:   Negative  GBS:   Negative   Assessment/Plan: 39 weeks and 2 days with type 2 diabetes for induction.  - admit to labor and delivery  - cytotec for cervical ripening 25 mcg vaginal and 25 mcg po for loading dose followed by 25 mcg vagina every 5 hours as needed  - Endotool for management of diabetes.  - Pain control per patient preference. -Anticipate SVD   - Dr. Cherlynn Polo- lott / Dr. Connye Burkitt covering.    Gerald Leitz 06/28/2023, 3:51 PM

## 2023-06-29 ENCOUNTER — Other Ambulatory Visit: Payer: Self-pay | Admitting: *Deleted

## 2023-06-29 ENCOUNTER — Telehealth (HOSPITAL_COMMUNITY): Payer: Self-pay | Admitting: *Deleted

## 2023-06-29 DIAGNOSIS — O24113 Pre-existing diabetes mellitus, type 2, in pregnancy, third trimester: Secondary | ICD-10-CM

## 2023-06-29 DIAGNOSIS — O99213 Obesity complicating pregnancy, third trimester: Secondary | ICD-10-CM

## 2023-06-29 NOTE — Telephone Encounter (Signed)
Preadmission screen  

## 2023-06-30 ENCOUNTER — Encounter (HOSPITAL_COMMUNITY): Payer: Self-pay | Admitting: *Deleted

## 2023-07-01 ENCOUNTER — Inpatient Hospital Stay (HOSPITAL_COMMUNITY): Payer: BC Managed Care – PPO | Admitting: Anesthesiology

## 2023-07-01 ENCOUNTER — Inpatient Hospital Stay (HOSPITAL_COMMUNITY)
Admission: AD | Admit: 2023-07-01 | Discharge: 2023-07-02 | DRG: 806 | Disposition: A | Discharge status: Home / Self Care | Payer: BC Managed Care – PPO | Attending: Obstetrics and Gynecology | Admitting: Obstetrics and Gynecology

## 2023-07-01 ENCOUNTER — Other Ambulatory Visit: Payer: Self-pay

## 2023-07-01 ENCOUNTER — Encounter (HOSPITAL_COMMUNITY): Payer: Self-pay | Admitting: Family Medicine

## 2023-07-01 DIAGNOSIS — Z794 Long term (current) use of insulin: Secondary | ICD-10-CM

## 2023-07-01 DIAGNOSIS — Z7984 Long term (current) use of oral hypoglycemic drugs: Secondary | ICD-10-CM

## 2023-07-01 DIAGNOSIS — Z8249 Family history of ischemic heart disease and other diseases of the circulatory system: Secondary | ICD-10-CM | POA: Diagnosis not present

## 2023-07-01 DIAGNOSIS — O99893 Other specified diseases and conditions complicating puerperium: Secondary | ICD-10-CM | POA: Diagnosis present

## 2023-07-01 DIAGNOSIS — D62 Acute posthemorrhagic anemia: Secondary | ICD-10-CM | POA: Diagnosis not present

## 2023-07-01 DIAGNOSIS — Z87891 Personal history of nicotine dependence: Secondary | ICD-10-CM | POA: Diagnosis not present

## 2023-07-01 DIAGNOSIS — O2412 Pre-existing diabetes mellitus, type 2, in childbirth: Principal | ICD-10-CM | POA: Diagnosis present

## 2023-07-01 DIAGNOSIS — R030 Elevated blood-pressure reading, without diagnosis of hypertension: Secondary | ICD-10-CM | POA: Diagnosis not present

## 2023-07-01 DIAGNOSIS — Z833 Family history of diabetes mellitus: Secondary | ICD-10-CM

## 2023-07-01 DIAGNOSIS — O9081 Anemia of the puerperium: Secondary | ICD-10-CM | POA: Diagnosis not present

## 2023-07-01 DIAGNOSIS — E119 Type 2 diabetes mellitus without complications: Secondary | ICD-10-CM | POA: Diagnosis not present

## 2023-07-01 DIAGNOSIS — Z3A38 38 weeks gestation of pregnancy: Secondary | ICD-10-CM

## 2023-07-01 DIAGNOSIS — O24113 Pre-existing diabetes mellitus, type 2, in pregnancy, third trimester: Principal | ICD-10-CM | POA: Diagnosis present

## 2023-07-01 DIAGNOSIS — O26893 Other specified pregnancy related conditions, third trimester: Secondary | ICD-10-CM | POA: Diagnosis not present

## 2023-07-01 HISTORY — DX: Urinary tract infection, site not specified: N39.0

## 2023-07-01 HISTORY — DX: Unspecified abnormal cytological findings in specimens from vagina: R87.629

## 2023-07-01 LAB — COMPREHENSIVE METABOLIC PANEL WITH GFR
ALT: 28 U/L (ref 0–44)
AST: 23 U/L (ref 15–41)
Albumin: 2.5 g/dL — ABNORMAL LOW (ref 3.5–5.0)
Alkaline Phosphatase: 202 U/L — ABNORMAL HIGH (ref 38–126)
Anion gap: 8 (ref 5–15)
BUN: 6 mg/dL (ref 6–20)
CO2: 19 mmol/L — ABNORMAL LOW (ref 22–32)
Calcium: 8.7 mg/dL — ABNORMAL LOW (ref 8.9–10.3)
Chloride: 106 mmol/L (ref 98–111)
Creatinine, Ser: 0.71 mg/dL (ref 0.44–1.00)
GFR, Estimated: >60 mL/min (ref >60)
Glucose, Bld: 108 mg/dL — ABNORMAL HIGH (ref 70–99)
Potassium: 3.3 mmol/L — ABNORMAL LOW (ref 3.5–5.1)
Sodium: 133 mmol/L — ABNORMAL LOW (ref 135–145)
Total Bilirubin: 0.8 mg/dL (ref 0.3–1.2)
Total Protein: 6.6 g/dL (ref 6.5–8.1)

## 2023-07-01 LAB — PROTEIN / CREATININE RATIO, URINE
Creatinine, Urine: 200 mg/dL
Protein Creatinine Ratio: 0.24 mg/mg{creat} — ABNORMAL HIGH (ref 0.00–0.15)
Total Protein, Urine: 48 mg/dL

## 2023-07-01 LAB — TYPE AND SCREEN
ABO/RH(D): O POS
Antibody Screen: NEGATIVE

## 2023-07-01 LAB — CBC
HCT: 34.2 % — ABNORMAL LOW (ref 36.0–46.0)
Hemoglobin: 11.5 g/dL — ABNORMAL LOW (ref 12.0–15.0)
MCH: 24.7 pg — ABNORMAL LOW (ref 26.0–34.0)
MCHC: 33.6 g/dL (ref 30.0–36.0)
MCV: 73.5 fL — ABNORMAL LOW (ref 80.0–100.0)
Platelets: 290 10*3/uL (ref 150–400)
RBC: 4.65 MIL/uL (ref 3.87–5.11)
RDW: 15.1 % (ref 11.5–15.5)
WBC: 7.6 10*3/uL (ref 4.0–10.5)
nRBC: 0 % (ref 0.0–0.2)

## 2023-07-01 LAB — RPR: RPR Ser Ql: NONREACTIVE

## 2023-07-01 LAB — GLUCOSE, CAPILLARY: Glucose-Capillary: 96 mg/dL (ref 70–99)

## 2023-07-01 MED ORDER — DIPHENHYDRAMINE HCL 50 MG/ML IJ SOLN: 12.5000 mg | INTRAMUSCULAR | Status: DC | PRN

## 2023-07-01 MED ORDER — LACTATED RINGERS IV BOLUS: 1000.0000 mL | Freq: Once | INTRAVENOUS | Status: DC

## 2023-07-01 MED ORDER — ACETAMINOPHEN 325 MG PO TABS: 650.0000 mg | ORAL_TABLET | ORAL | Status: DC | PRN

## 2023-07-01 MED ORDER — FENTANYL-BUPIVACAINE-NACL 0.5-0.125-0.9 MG/250ML-% EP SOLN
12.0000 mL/h | EPIDURAL | Status: DC | PRN
  Administered 2023-07-01: 12 mL/h via EPIDURAL
  Filled 2023-07-01: qty 250

## 2023-07-01 MED ORDER — LIDOCAINE HCL (PF) 1 % IJ SOLN: 30.0000 mL | INTRAMUSCULAR | Status: DC | PRN

## 2023-07-01 MED ORDER — BENZOCAINE-MENTHOL 20-0.5 % EX AERO: 1.0000 | INHALATION_SPRAY | CUTANEOUS | Status: DC | PRN

## 2023-07-01 MED ORDER — PRENATAL MULTIVITAMIN CH
1.0000 | ORAL_TABLET | Freq: Every day | ORAL | Status: DC
  Administered 2023-07-02: 1 via ORAL
  Filled 2023-07-01: qty 1

## 2023-07-01 MED ORDER — SOD CITRATE-CITRIC ACID 500-334 MG/5ML PO SOLN: 30.0000 mL | ORAL | Status: DC | PRN

## 2023-07-01 MED ORDER — TETANUS-DIPHTH-ACELL PERTUSSIS 5-2.5-18.5 LF-MCG/0.5 IM SUSY: 0.5000 mL | PREFILLED_SYRINGE | Freq: Once | INTRAMUSCULAR | Status: DC

## 2023-07-01 MED ORDER — PHENYLEPHRINE 80 MCG/ML (10ML) SYRINGE FOR IV PUSH (FOR BLOOD PRESSURE SUPPORT): 80.0000 ug | PREFILLED_SYRINGE | INTRAVENOUS | Status: DC | PRN

## 2023-07-01 MED ORDER — ONDANSETRON HCL 4 MG/2ML IJ SOLN: 4.0000 mg | Freq: Four times a day (QID) | INTRAMUSCULAR | Status: DC | PRN

## 2023-07-01 MED ORDER — IBUPROFEN 600 MG PO TABS
600.0000 mg | ORAL_TABLET | Freq: Four times a day (QID) | ORAL | Status: DC
  Administered 2023-07-01 – 2023-07-02 (×4): 600 mg via ORAL
  Filled 2023-07-01 (×4): qty 1

## 2023-07-01 MED ORDER — COCONUT OIL OIL: 1.0000 | TOPICAL_OIL | Status: DC | PRN

## 2023-07-01 MED ORDER — TRANEXAMIC ACID-NACL 1000-0.7 MG/100ML-% IV SOLN
INTRAVENOUS | Status: AC
  Administered 2023-07-01: 1000 mg via INTRAVENOUS
  Filled 2023-07-01: qty 100

## 2023-07-01 MED ORDER — ZOLPIDEM TARTRATE 5 MG PO TABS: 5.0000 mg | ORAL_TABLET | Freq: Every evening | ORAL | Status: DC | PRN

## 2023-07-01 MED ORDER — ONDANSETRON HCL 4 MG PO TABS: 4.0000 mg | ORAL_TABLET | ORAL | Status: DC | PRN

## 2023-07-01 MED ORDER — SENNOSIDES-DOCUSATE SODIUM 8.6-50 MG PO TABS
2.0000 | ORAL_TABLET | Freq: Every day | ORAL | Status: DC
  Administered 2023-07-02: 2 via ORAL
  Filled 2023-07-01: qty 2

## 2023-07-01 MED ORDER — ONDANSETRON HCL 4 MG/2ML IJ SOLN: 4.0000 mg | INTRAMUSCULAR | Status: DC | PRN

## 2023-07-01 MED ORDER — DIBUCAINE (PERIANAL) 1 % EX OINT: 1.0000 | TOPICAL_OINTMENT | CUTANEOUS | Status: DC | PRN

## 2023-07-01 MED ORDER — EPHEDRINE 5 MG/ML INJ: 10.0000 mg | INTRAVENOUS | Status: DC | PRN

## 2023-07-01 MED ORDER — OXYCODONE-ACETAMINOPHEN 5-325 MG PO TABS: 2.0000 | ORAL_TABLET | ORAL | Status: DC | PRN

## 2023-07-01 MED ORDER — LACTATED RINGERS IV SOLN: 500.0000 mL | Freq: Once | INTRAVENOUS | Status: DC

## 2023-07-01 MED ORDER — LACTATED RINGERS IV SOLN
500.0000 mL | INTRAVENOUS | Status: DC | PRN
  Administered 2023-07-01: 1000 mL via INTRAVENOUS

## 2023-07-01 MED ORDER — BUPIVACAINE HCL (PF) 0.25 % IJ SOLN
INTRAMUSCULAR | Status: DC | PRN
  Administered 2023-07-01: 10 mL via EPIDURAL

## 2023-07-01 MED ORDER — OXYCODONE-ACETAMINOPHEN 5-325 MG PO TABS: 1.0000 | ORAL_TABLET | ORAL | Status: DC | PRN

## 2023-07-01 MED ORDER — FENTANYL CITRATE (PF) 100 MCG/2ML IJ SOLN
50.0000 ug | INTRAMUSCULAR | Status: DC | PRN
  Administered 2023-07-01: 100 ug via INTRAVENOUS
  Filled 2023-07-01: qty 2

## 2023-07-01 MED ORDER — OXYTOCIN BOLUS FROM INFUSION
333.0000 mL | Freq: Once | INTRAVENOUS | Status: AC
  Administered 2023-07-01: 333 mL via INTRAVENOUS

## 2023-07-01 MED ORDER — LACTATED RINGERS IV SOLN: INTRAVENOUS | Status: DC

## 2023-07-01 MED ORDER — WITCH HAZEL-GLYCERIN EX PADS: 1.0000 | MEDICATED_PAD | CUTANEOUS | Status: DC | PRN

## 2023-07-01 MED ORDER — TRANEXAMIC ACID-NACL 1000-0.7 MG/100ML-% IV SOLN: 1000.0000 mg | INTRAVENOUS | Status: AC

## 2023-07-01 MED ORDER — SIMETHICONE 80 MG PO CHEW: 80.0000 mg | CHEWABLE_TABLET | ORAL | Status: DC | PRN

## 2023-07-01 MED ORDER — LIDOCAINE-EPINEPHRINE (PF) 2 %-1:200000 IJ SOLN
INTRAMUSCULAR | Status: DC | PRN
  Administered 2023-07-01: 5 mL via EPIDURAL

## 2023-07-01 MED ORDER — DIPHENHYDRAMINE HCL 25 MG PO CAPS: 25.0000 mg | ORAL_CAPSULE | Freq: Four times a day (QID) | ORAL | Status: DC | PRN

## 2023-07-01 MED ORDER — OXYTOCIN-SODIUM CHLORIDE 30-0.9 UT/500ML-% IV SOLN
2.5000 [IU]/h | INTRAVENOUS | Status: DC
  Filled 2023-07-01: qty 500

## 2023-07-01 NOTE — H&P (Addendum)
Helen Dorsey is a 32 y.o. female G2P1001 at 38 weeks and 4 days presenting for SOL.   Pt is on the following insulin regimen  48 units of NPH in AM and 26 units of regular in AM . .. she is on 22 units of NPH in pm  and 20 units regular in pm. Blood sugars are well controlled on this regimen. Only on metformin prior to pregnancy.   Est. FW: 3434 gm 7 lb 9 oz 87 % on 9/6; tested to 3130g   She has a history of gestational  hypertension with previous pregnancy. She has been on 81 mg of aspirin daily. Had 2 elevated BPs on arrival. Asymptomatic.   Prenatal care provided by Dr. Gerald Leitz with [facility]  Past Medical History:  Diagnosis Date   Diabetes mellitus without complication (HCC)    DKA (diabetic ketoacidosis) (HCC) 11/08/2020   Pregnancy induced hypertension    UTI (urinary tract infection)    Vaginal Pap smear, abnormal    HPV   Past Surgical History:  Procedure Laterality Date   NO PAST SURGERIES     Family History: family history includes Cancer in her maternal grandmother; Diabetes in her brother and sister; Heart disease in her mother; Hypertension in her mother. Social History:  reports that she quit smoking about 19 months ago. Her smoking use included cigarettes. She has never used smokeless tobacco. She reports that she does not currently use alcohol. She reports that she does not use drugs.     Maternal Diabetes: Yes:  Diabetes Type:  Pre-pregnancy Genetic Screening: Declined Maternal Ultrasounds/Referrals: Normal Fetal Ultrasounds or other Referrals:  Fetal echo Maternal Substance Abuse:  No Significant Maternal Medications:  Meds include: Other:  Insulin  Significant Maternal Lab Results:  Group B Strep negative Number of Prenatal Visits:greater than 3 verified prenatal visits Other Comments:  None   History Dilation: 7.5 Effacement (%): 90 Station: -1 Exam by:: Dr. Salvadore Dom Blood pressure 134/78, pulse (!) 102, temperature 98 F (36.7 C),  temperature source Oral, resp. rate 16, height 5\' 2"  (1.575 m), weight 97.2 kg, last menstrual period 10/04/2022, SpO2 97%, unknown if currently breastfeeding. Maternal Exam:  Introitus: Normal vulva.  General: Appears well, no acute distress. Age appropriate. Cardiac: mildly tachycardic. Respiratory: normal effort Abdomen: gravid Extremities: No edema or cyanosis. Skin: Warm and dry, no rashes noted Neuro: alert and oriented, no focal deficits Psych: normal affect  Prenatal labs: ABO, Rh: O/Positive/-- (02/29 0000)O positive  Antibody:  Negative  Rubella: Immune (02/29 0000)Immune  RPR: Nonreactive (07/24 0000) Negative  HBsAg: Negative (02/29 0000) Negative  HIV: Non-reactive (02/29 0000) Negative  GBS: Negative/-- (09/06 0000) Negative   Assessment/Plan: 38 weeks and 4 days with type 2 diabetes for  SOL - admit to labor and delivery; expectant management - S/P AROM - Endotool for management of diabetes if necessary; unlikely to need due to advancing labor and CBG of 108 on admission.  - Epidural placed -Anticipate SVD    Helen Dorsey 07/01/2023, 8:13 AM

## 2023-07-01 NOTE — Progress Notes (Signed)
Called by RN due to delayed PPH. The patient had evidence of excessive bleeding when she attempted to get her ready to transfer to the [facility] floor. Presented to the room and Dr. Vergie Living at bedside assessing bleeding with bimanual massage s/p clot. TXA and IVF was running with improving soft BPs. She is currently stable and will plan to transfer to [facility] when appropriate. Please appreciate Dr. Tarri Glenn progress note for additional details.   Helen Greeson Autry-Lott, DO 07/01/2023, 2:21 PM

## 2023-07-01 NOTE — MAU Note (Signed)
Helen Dorsey is a 32 y.o. at [redacted]w[redacted]d here in MAU reporting: started contracting yesterday, so been at it for 24hrs.  Now every 5 min. No bleeding or leaking, lost some of her mucous plug.  Has not been checked.  Reports +FM.  Onset of complaint: 24hrs ago Pain score: 10 There were no vitals filed for this visit.   FHT:+FM, taken straight to rm Lab orders placed from triage:

## 2023-07-01 NOTE — Anesthesia Procedure Notes (Signed)
Epidural Patient location during procedure: OB Start time: 07/01/2023 9:00 AM End time: 07/01/2023 9:10 AM  Staffing Anesthesiologist: Mariann Barter, MD Performed: anesthesiologist   Preanesthetic Checklist Completed: patient identified, IV checked, site marked, risks and benefits discussed, surgical consent, monitors and equipment checked, pre-op evaluation and timeout performed  Epidural Patient position: sitting Prep: DuraPrep Patient monitoring: heart rate, cardiac monitor, continuous pulse ox and blood pressure Approach: midline Location: L4-L5 Injection technique: LOR saline  Needle:  Needle type: Tuohy  Needle gauge: 17 G Needle length: 9 cm Needle insertion depth: 6 cm Catheter at skin depth: 11 cm Test dose: negative and 1.5% lidocaine with Epi 1:200 K  Assessment Events: blood not aspirated, no cerebrospinal fluid, injection not painful, no injection resistance, no paresthesia and negative IV test  Additional Notes Reason for block:procedure for pain

## 2023-07-01 NOTE — Progress Notes (Signed)
OB Note Called to see patient, with Dr. Salvadore Dom on the way, to assess for bleeding. RN came to get patient to go upstairs and she had a large amount of bleeding on the pad she was sitting on. RN able to express large amounts of clot. Foley already in. Approximately of clot and blood on pad when I came in and TXA ordered. Bimanual massage, which pt tolerated well, showed firm fundus and small amount of clot in the lower uterine segment (cervix 3-4cm dilated) that I brought up and express. Lower uterine segment also firm. AF VS normal and stable, and care turned over to Dr. Brown Human MD Attending Center for [facility] (Faculty [facility]) 07/01/2023 Time: 1300

## 2023-07-01 NOTE — Anesthesia Preprocedure Evaluation (Signed)
Anesthesia Evaluation  Patient identified by MRN, date of birth, ID band Patient awake    Reviewed: Allergy & Precautions, NPO status , Patient's Chart, lab work & pertinent test results, reviewed documented beta blocker date and time   History of Anesthesia Complications Negative for: history of anesthetic complications  Airway Mallampati: III  TM Distance: >3 FB     Dental   Pulmonary neg sleep apnea, neg pneumonia , neg COPD, neg recent URI, former smoker, neg PE   breath sounds clear to auscultation       Cardiovascular hypertension, (-) angina (-) CAD, (-) Past MI, (-) CABG, (-) Peripheral Vascular Disease, (-) Orthopnea and (-) DOE (-) dysrhythmias  Rhythm:Regular Rate:Normal     Neuro/Psych neg Seizures    GI/Hepatic ,neg GERD  ,,(+) neg Cirrhosis        Endo/Other  diabetes, Type 2    Renal/GU Renal disease     Musculoskeletal   Abdominal   Peds  Hematology   Anesthesia Other Findings   Reproductive/Obstetrics                             Anesthesia Physical Anesthesia Plan  ASA: 2  Anesthesia Plan: Epidural   Post-op Pain Management:    Induction:   PONV Risk Score and Plan:   Airway Management Planned:   Additional Equipment:   Intra-op Plan:   Post-operative Plan:   Informed Consent: I have reviewed the patients History and Physical, chart, labs and discussed the procedure including the risks, benefits and alternatives for the proposed anesthesia with the patient or authorized representative who has indicated his/her understanding and acceptance.     Dental advisory given  Plan Discussed with: CRNA  Anesthesia Plan Comments:        Anesthesia Quick Evaluation

## 2023-07-01 NOTE — Lactation Note (Signed)
This note was copied from a baby's chart. Lactation Consultation Note Mom chooses to formula feed.  Patient Name: Boy Elayne Tolsma [id number] Date: 07/01/2023 Age:34 hours     Maternal Data    Feeding Nipple Type: Slow - flow  LATCH Score                    Lactation Tools Discussed/Used    Interventions    Discharge    Consult Status Consult Status: Complete    Sandrina Heaton G 07/01/2023, 8:14 PM

## 2023-07-02 ENCOUNTER — Other Ambulatory Visit: Payer: Self-pay | Admitting: Family Medicine

## 2023-07-02 ENCOUNTER — Ambulatory Visit: Payer: BC Managed Care – PPO

## 2023-07-02 LAB — CBC
HCT: 25.8 % — ABNORMAL LOW (ref 36.0–46.0)
Hemoglobin: 8.8 g/dL — ABNORMAL LOW (ref 12.0–15.0)
MCH: 25 pg — ABNORMAL LOW (ref 26.0–34.0)
MCHC: 34.1 g/dL (ref 30.0–36.0)
MCV: 73.3 fL — ABNORMAL LOW (ref 80.0–100.0)
Platelets: 240 10*3/uL (ref 150–400)
RBC: 3.52 MIL/uL — ABNORMAL LOW (ref 3.87–5.11)
RDW: 15.1 % (ref 11.5–15.5)
WBC: 10.6 10*3/uL — ABNORMAL HIGH (ref 4.0–10.5)
nRBC: 0 % (ref 0.0–0.2)

## 2023-07-02 LAB — BASIC METABOLIC PANEL
Anion gap: 9 (ref 5–15)
BUN: 7 mg/dL (ref 6–20)
CO2: 21 mmol/L — ABNORMAL LOW (ref 22–32)
Calcium: 8.3 mg/dL — ABNORMAL LOW (ref 8.9–10.3)
Chloride: 107 mmol/L (ref 98–111)
Creatinine, Ser: 0.76 mg/dL (ref 0.44–1.00)
GFR, Estimated: >60 mL/min (ref >60)
Glucose, Bld: 97 mg/dL (ref 70–99)
Potassium: 3.1 mmol/L — ABNORMAL LOW (ref 3.5–5.1)
Sodium: 137 mmol/L (ref 135–145)

## 2023-07-02 LAB — GLUCOSE, CAPILLARY: Glucose-Capillary: 84 mg/dL (ref 70–99)

## 2023-07-02 MED ORDER — NIFEDIPINE ER OSMOTIC RELEASE 30 MG PO TB24: 30.0000 mg | ORAL_TABLET | Freq: Every day | ORAL | Status: DC

## 2023-07-02 MED ORDER — POTASSIUM CHLORIDE CRYS ER 20 MEQ PO TBCR
40.0000 meq | EXTENDED_RELEASE_TABLET | Freq: Once | ORAL | Status: AC
  Administered 2023-07-02: 40 meq via ORAL
  Filled 2023-07-02: qty 2

## 2023-07-02 MED ORDER — IBUPROFEN 600 MG PO TABS: 600.0000 mg | ORAL_TABLET | Freq: Four times a day (QID) | ORAL | 0 refills | Status: AC

## 2023-07-02 MED ORDER — NIFEDIPINE ER 30 MG PO TB24: 30.0000 mg | ORAL_TABLET | Freq: Every day | ORAL | 0 refills | Status: AC

## 2023-07-02 MED ORDER — FERROUS SULFATE 325 (65 FE) MG PO TBEC: 325.0000 mg | DELAYED_RELEASE_TABLET | Freq: Every day | ORAL | 0 refills | Status: DC

## 2023-07-02 MED ORDER — ACETAMINOPHEN 325 MG PO TABS: 650.0000 mg | ORAL_TABLET | ORAL | 0 refills | Status: AC | PRN

## 2023-07-02 NOTE — Anesthesia Postprocedure Evaluation (Signed)
Anesthesia Post Note  Patient: Helen Dorsey  Procedure(s) Performed: AN AD HOC LABOR EPIDURAL     Patient location during evaluation: Mother Baby Anesthesia Type: Epidural Level of consciousness: awake Pain management: satisfactory to patient Vital Signs Assessment: post-procedure vital signs reviewed and stable Respiratory status: spontaneous breathing Cardiovascular status: stable Anesthetic complications: no  No notable events documented.  Last Vitals:  Vitals:   07/02/23 0024 07/02/23 0350  BP: 121/85 118/87  Pulse: 87 93  Resp:  16  Temp:  37.1 C  SpO2:  100%    Last Pain:  Vitals:   07/02/23 0553  TempSrc:   PainSc: 0-No pain   Pain Goal:                   [name]

## 2023-07-02 NOTE — Plan of Care (Signed)
Adequate for discharge.

## 2023-07-02 NOTE — Discharge Summary (Addendum)
Postpartum Discharge Summary     Patient Name: Helen Dorsey DOB: Apr 15, 1991 MRN: [id number]  Date of admission: 07/01/2023 Delivery date:07/01/2023 Delivering provider: Lavonda Jumbo Date of discharge: 07/02/2023  Admitting diagnosis: Type 2 diabetes mellitus affecting pregnancy in third trimester, antepartum [O24.113] Intrauterine pregnancy: [redacted]w[redacted]d     Secondary diagnosis:  Principal Problem:   Type 2 diabetes mellitus affecting pregnancy in third trimester, antepartum Active Problems:   Postpartum hemorrhage   Vaginal delivery  Additional problems: short interval pregnancy, hx of gestational HTN    Discharge diagnosis: Term Pregnancy Delivered, Type 2 DM, and Anemia                                              Post partum procedures: n/a Augmentation: AROM Complications: Hemorrhage>1062mL  Hospital course: Onset of Labor With Vaginal Delivery      32 y.o. yo [id number] at [redacted]w[redacted]d was admitted in Active Labor on 07/01/2023. Labor course was uncomplicated.   Membrane Rupture Time/Date: 9:49 AM,07/01/2023  Delivery Method:Vaginal, Spontaneous Operative Delivery:N/A Episiotomy: None Lacerations:  None Patient had a postpartum course complicated by delayed postpartum hemorrhage (EBL ) that was resolved with TXA and bimanual massage. Follow up hgb was 8.8 and asymptomatic, she was instructed to continue PNV and start oral iron. Postpartum course also complicated by mildly elevated BP readings. She was discharged with procardia 30 mg daily.  She is ambulating, tolerating a regular diet, passing flatus, and urinating well. Patient is discharged home in stable condition on 07/02/23.  Newborn Data: Birth date:07/01/2023 Birth time:11:14 AM Gender:Female Living status:Living Apgars:9 ,9  Weight:3540 g  Magnesium Sulfate received: No BMZ received: No Rhophylac:No MMR:No T-DaP:Given prenatally Flu: No Transfusion:No  Physical exam  Vitals:   07/01/23 2044  07/01/23 2327 07/02/23 0024 07/02/23 0350  BP: 118/80 (!) 122/90 121/85 118/87  Pulse: 85 87 87 93  Resp:  18  16  Temp:  97.6 F (36.4 C)  98.7 F (37.1 C)  TempSrc:  Oral  Oral  SpO2:  100%  100%  Weight:      Height:       General: alert, cooperative, and no distress Lochia: appropriate Uterine Fundus: firm Incision: N/A DVT Evaluation: No evidence of DVT seen on physical exam. Negative Homan's sign. No cords or calf tenderness. No significant calf/ankle edema. Labs: Lab Results  Component Value Date   WBC 10.6 (H) 07/02/2023   HGB 8.8 (L) 07/02/2023   HCT 25.8 (L) 07/02/2023   MCV 73.3 (L) 07/02/2023   PLT 240 07/02/2023      Latest Ref Rng & Units 07/02/2023    5:58 AM  CMP  Glucose 70 - 99 mg/dL 97   BUN 6 - 20 mg/dL 7   Creatinine 1.47 - 8.29 mg/dL 5.62   Sodium 130 - 865 mmol/L 137   Potassium 3.5 - 5.1 mmol/L 3.1   Chloride 98 - 111 mmol/L 107   CO2 22 - 32 mmol/L 21   Calcium 8.9 - 10.3 mg/dL 8.3    Edinburgh Score:    07/02/2023    5:53 AM  Edinburgh Postnatal Depression Scale Screening Tool  I have been able to laugh and see the funny side of things. 0  I have looked forward with enjoyment to things. 0  I have blamed myself unnecessarily when things went wrong. 0  I have been  anxious or worried for no good reason. 0  I have felt scared or panicky for no good reason. 0  Things have been getting on top of me. 0  I have been so unhappy that I have had difficulty sleeping. 0  I have felt sad or miserable. 0  I have been so unhappy that I have been crying. 0  The thought of harming myself has occurred to me. 0  Edinburgh Postnatal Depression Scale Total 0      After visit meds:  Allergies as of 07/02/2023   No Known Allergies      Medication List     STOP taking these medications    aspirin EC 81 MG tablet   insulin lispro 100 UNIT/ML injection Commonly known as: HUMALOG   insulin NPH-regular Human (70-30) 100 UNIT/ML injection        TAKE these medications    acetaminophen 325 MG tablet Commonly known as: Tylenol Take 2 tablets (650 mg total) by mouth every 4 (four) hours as needed (for pain scale < 4).   ibuprofen 600 MG tablet Commonly known as: ADVIL Take 1 tablet (600 mg total) by mouth every 6 (six) hours.   metFORMIN 500 MG tablet Commonly known as: GLUCOPHAGE Take by mouth 2 (two) times daily with a meal.   prenatal multivitamin Tabs tablet Take 1 tablet by mouth daily at 12 noon.         Discharge home in stable condition Infant Feeding: Bottle and Breast Infant Disposition:home with mother Discharge instruction: per After Visit Summary and Postpartum booklet. Activity: Advance as tolerated. Pelvic rest for 6 weeks.  Diet: routine diet Anticipated Birth Control: Unsure Postpartum Appointment:6 weeks Additional Postpartum F/U: BP check 1 week Future Appointments:No future appointments. Follow up Visit: Message sent to clinic to schedule BP check.      07/02/2023 Aydian Dimmick Autry-Lott, DO

## 2023-07-06 ENCOUNTER — Inpatient Hospital Stay (HOSPITAL_COMMUNITY): Payer: BC Managed Care – PPO

## 2023-07-06 ENCOUNTER — Inpatient Hospital Stay (HOSPITAL_COMMUNITY): Admission: AD | Admit: 2023-07-06 | Payer: BC Managed Care – PPO | Source: Home / Self Care

## 2023-07-07 LAB — BIRTH TISSUE RECOVERY COLLECTION (PLACENTA DONATION)

## 2023-07-28 ENCOUNTER — Telehealth (HOSPITAL_COMMUNITY): Payer: Self-pay | Admitting: *Deleted

## 2023-07-28 NOTE — Telephone Encounter (Signed)
07/28/2023  Name: Zenab Gronewold MRN: [id number] DOB: Mar 05, 1991  Reason for Call:  Transition of Care Hospital Discharge Call  Contact Status: Patient Contact Status: Message  Language assistant needed:          Follow-Up Questions:    Inocente Salles Postnatal Depression Scale:  In the Past 7 Days:    PHQ2-9 Depression Scale:     Discharge Follow-up:    Post-discharge interventions: NA  Salena Saner, RN 07/28/2023 13:55

## 2023-08-25 DIAGNOSIS — Z30017 Encounter for initial prescription of implantable subdermal contraceptive: Secondary | ICD-10-CM | POA: Diagnosis not present

## 2023-08-25 DIAGNOSIS — Z3202 Encounter for pregnancy test, result negative: Secondary | ICD-10-CM | POA: Diagnosis not present

## 2023-10-19 DIAGNOSIS — N920 Excessive and frequent menstruation with regular cycle: Secondary | ICD-10-CM | POA: Diagnosis not present

## 2023-10-19 DIAGNOSIS — L600 Ingrowing nail: Secondary | ICD-10-CM | POA: Diagnosis not present

## 2023-10-19 DIAGNOSIS — M7989 Other specified soft tissue disorders: Secondary | ICD-10-CM | POA: Diagnosis not present

## 2023-10-19 DIAGNOSIS — M25774 Osteophyte, right foot: Secondary | ICD-10-CM | POA: Diagnosis not present

## 2023-10-19 DIAGNOSIS — Z3046 Encounter for surveillance of implantable subdermal contraceptive: Secondary | ICD-10-CM | POA: Diagnosis not present

## 2023-12-02 DIAGNOSIS — E669 Obesity, unspecified: Secondary | ICD-10-CM | POA: Diagnosis not present

## 2023-12-02 DIAGNOSIS — E1169 Type 2 diabetes mellitus with other specified complication: Secondary | ICD-10-CM | POA: Diagnosis not present

## 2024-07-21 DIAGNOSIS — L03031 Cellulitis of right toe: Secondary | ICD-10-CM | POA: Diagnosis not present

## 2024-07-21 DIAGNOSIS — M898X7 Other specified disorders of bone, ankle and foot: Secondary | ICD-10-CM | POA: Diagnosis not present

## 2024-07-21 DIAGNOSIS — M216X1 Other acquired deformities of right foot: Secondary | ICD-10-CM | POA: Diagnosis not present

## 2024-09-26 DIAGNOSIS — Z01419 Encounter for gynecological examination (general) (routine) without abnormal findings: Secondary | ICD-10-CM | POA: Diagnosis not present

## 2024-09-26 DIAGNOSIS — N898 Other specified noninflammatory disorders of vagina: Secondary | ICD-10-CM | POA: Diagnosis not present

## 2024-09-26 DIAGNOSIS — Z1330 Encounter for screening examination for mental health and behavioral disorders, unspecified: Secondary | ICD-10-CM | POA: Diagnosis not present

## 2024-09-26 DIAGNOSIS — N920 Excessive and frequent menstruation with regular cycle: Secondary | ICD-10-CM | POA: Diagnosis not present
